# Patient Record
Sex: Female | Born: 1960 | Race: Black or African American | Hispanic: No | Marital: Single | Smoking: Current every day smoker
Health system: Southern US, Community
[De-identification: ages and names within clinical notes are randomized; demographics above are authoritative.]

## PROBLEM LIST (undated history)

## (undated) DIAGNOSIS — G43909 Migraine, unspecified, not intractable, without status migrainosus: Secondary | ICD-10-CM

## (undated) DIAGNOSIS — E119 Type 2 diabetes mellitus without complications: Secondary | ICD-10-CM

## (undated) DIAGNOSIS — H409 Unspecified glaucoma: Secondary | ICD-10-CM

## (undated) HISTORY — DX: Unspecified glaucoma: H40.9

## (undated) HISTORY — PX: GLAUCOMA SURGERY: SHX656

## (undated) HISTORY — DX: Migraine, unspecified, not intractable, without status migrainosus: G43.909

---

## 2018-06-22 ENCOUNTER — Other Ambulatory Visit: Payer: Self-pay | Admitting: Pediatric Intensive Care

## 2018-06-22 DIAGNOSIS — Z20822 Contact with and (suspected) exposure to covid-19: Secondary | ICD-10-CM

## 2018-06-23 LAB — NOVEL CORONAVIRUS, NAA: SARS-CoV-2, NAA: NOT DETECTED

## 2019-02-23 ENCOUNTER — Emergency Department
Admission: EM | Admit: 2019-02-23 | Discharge: 2019-02-23 | Disposition: A | Discharge status: Home / Self Care | Payer: Self-pay | Attending: Emergency Medicine | Admitting: Emergency Medicine

## 2019-02-23 ENCOUNTER — Other Ambulatory Visit: Payer: Self-pay

## 2019-02-23 DIAGNOSIS — H5462 Unqualified visual loss, left eye, normal vision right eye: Secondary | ICD-10-CM

## 2019-02-23 DIAGNOSIS — H53132 Sudden visual loss, left eye: Secondary | ICD-10-CM | POA: Insufficient documentation

## 2019-02-23 NOTE — Discharge Instructions (Addendum)
Go to [facility] immediately.

## 2019-02-23 NOTE — ED Triage Notes (Addendum)
Pt comes via POV from home with c/o Left eye pain. Pt state this started 2 weeks ago and has not gotten better.  Pt states dryness. Pt denies any discharge. Pt states it is cloudy.  Pt denies any numbess or tingling. Pt denies any CP or SOB.

## 2019-02-23 NOTE — ED Provider Notes (Signed)
[facility] Emergency Department Provider Note  ____________________________________________   First MD Initiated Contact with Patient 02/23/19 1510     (approximate)  I have reviewed the triage vital signs and the nursing notes.   HISTORY  Chief Complaint Eye Pain    HPI Natalie Gibson is a 59 y.o. female presents emergency department complaining of loss of vision in the left eye.  She states it started like a left clouding the left corner and has spread to the middle of the eye.  She cannot see details just outlines.  She does not use contacts.  No known injury.  Unsure of her hypertensive history.  She states she does not like to go to the doctor.  Patient states it is been worsening over the last 2 days.    History reviewed. No pertinent past medical history.  There are no problems to display for this patient.   History reviewed. No pertinent surgical history.  Prior to Admission medications   Not on File    Allergies Patient has no allergy information on record.  No family history on file.  Social History Social History   Tobacco Use  . Smoking status: Not on file  Substance Use Topics  . Alcohol use: Not on file  . Drug use: Not on file    Review of Systems  Constitutional: No fever/chills Eyes: Positive visual changes. ENT: No sore throat. Respiratory: Denies cough Cardiovascular: Denies chest pain Gastrointestinal: Denies abdominal pain Genitourinary: Negative for dysuria. Musculoskeletal: Negative for back pain. Skin: Negative for rash. Psychiatric: no mood changes,     ____________________________________________   PHYSICAL EXAM:  VITAL SIGNS: ED Triage Vitals  Enc Vitals Group     BP 02/23/19 1504 (!) 170/101     Pulse Rate 02/23/19 1504 100     Resp 02/23/19 1504 18     Temp 02/23/19 1504 98 F (36.7 C)     Temp src --      SpO2 02/23/19 1504 100 %     Weight 02/23/19 1502 272 lb (123.4 kg)     Height  02/23/19 1502 5\' 4"  (1.626 m)     Head Circumference --      Peak Flow --      Pain Score 02/23/19 1502 0     Pain Loc --      Pain Edu? --      Excl. in [place]? --     Constitutional: Alert and oriented. Well appearing and in no acute distress. Eyes: Conjunctivae are normal.  PERRL, sclera appears to be normal Head: Atraumatic. Nose: No congestion/rhinnorhea. Mouth/Throat: Mucous membranes are moist.   Neck:  supple no lymphadenopathy noted Cardiovascular: Normal rate, regular rhythm. Heart sounds are normal Respiratory: Normal respiratory effort.  No retractions, lungs c t a  GU: deferred Musculoskeletal: FROM all extremities, warm and well perfused Neurologic:  Normal speech and language.  Skin:  Skin is warm, dry and intact. No rash noted. Psychiatric: Mood and affect are normal. Speech and behavior are normal.  ____________________________________________   LABS (all labs ordered are listed, but only abnormal results are displayed)  Labs Reviewed - No data to display ____________________________________________   ____________________________________________  RADIOLOGY    ____________________________________________   PROCEDURES  Procedure(s) performed: No  Procedures    ____________________________________________   INITIAL IMPRESSION / ASSESSMENT AND PLAN / ED COURSE  Pertinent labs & imaging results that were available during my care of the patient were reviewed by me and considered  in my medical decision making (see chart for details).   Patient is 59 year old female presents emergency department with follow-up with complaints of decreased vision in the left eye.  Physical exam shows a visual acuity of 20/30 in the right eye and 20/200 in the left eye  Asked Dr. Derrill Kay if we should do MRI or CT.  Dr. Derrill Kay said to just call ophthalmology. Paged Dr. Druscilla Brownie   Dr. Druscilla Brownie wanted me to send the patient to the office immediately.  Explained  everything to the patient.  She was given the address and directions.  She was discharged in stable condition.  Natalie Gibson was evaluated in Emergency Department on 02/23/2019 for the symptoms described in the history of present illness. She was evaluated in the context of the global COVID-19 pandemic, which necessitated consideration that the patient might be at risk for infection with the SARS-CoV-2 virus that causes COVID-19. Institutional protocols and algorithms that pertain to the evaluation of patients at risk for COVID-19 are in a state of rapid change based on information released by regulatory bodies including the CDC and federal and state organizations. These policies and algorithms were followed during the patient's care in the ED.   As part of my medical decision making, I reviewed the following data within the electronic MEDICAL RECORD NUMBER Nursing notes reviewed and incorporated, Old chart reviewed, A consult was requested and obtained from this/these consultant(s) ophthalmology, Notes from prior ED visits and [place] Controlled Substance Database  ____________________________________________   FINAL CLINICAL IMPRESSION(S) / ED DIAGNOSES  Final diagnoses:  Visual loss, left eye      NEW MEDICATIONS STARTED DURING THIS VISIT:  There are no discharge medications for this patient.    Note:  This document was prepared using Dragon voice recognition software and may include unintentional dictation errors.    Faythe Ghee, PA-C 02/23/19 1652    Phineas Semen, MD 02/23/19 [phone]

## 2019-02-23 NOTE — ED Notes (Addendum)
Pt states she "cannot see" out of her left eye. States it started "like a cloud in the left corner" and has spread to the middle. States she cannot see "details" out of that eye, just outlines.  Pt wears bifocal glasses, denies contact use.  Pt denies any pain in eye.

## 2019-03-17 ENCOUNTER — Ambulatory Visit: Payer: Medicaid Other | Attending: Internal Medicine

## 2019-03-17 DIAGNOSIS — Z23 Encounter for immunization: Secondary | ICD-10-CM

## 2019-03-17 NOTE — Progress Notes (Signed)
   Covid-19 Vaccination Clinic  Name:  Mahek Schlesinger    MRN: [id number] DOB: 1960/01/27  03/17/2019  Ms. Leventhal was observed post Covid-19 immunization for 15 minutes without incident. She was provided with Vaccine Information Sheet and instruction to access the V-Safe system.   Ms. Kozuch was instructed to call 911 with any severe reactions post vaccine: Marland Kitchen Difficulty breathing  . Swelling of face and throat  . A fast heartbeat  . A bad rash all over body  . Dizziness and weakness   Immunizations Administered    Name Date Dose VIS Date Route   Pfizer COVID-19 Vaccine 03/17/2019  9:42 AM 0.3 mL 12/23/2018 Intramuscular   Manufacturer: [employer], [employer]   Lot: [id number]   NDC: [address]-4

## 2019-03-22 ENCOUNTER — Encounter: Payer: Self-pay | Admitting: Emergency Medicine

## 2019-03-22 ENCOUNTER — Other Ambulatory Visit: Payer: Self-pay

## 2019-03-22 ENCOUNTER — Emergency Department: Payer: Self-pay

## 2019-03-22 ENCOUNTER — Emergency Department
Admission: EM | Admit: 2019-03-22 | Discharge: 2019-03-22 | Disposition: A | Discharge status: Home / Self Care | Payer: Self-pay | Attending: Emergency Medicine | Admitting: Emergency Medicine

## 2019-03-22 DIAGNOSIS — F17210 Nicotine dependence, cigarettes, uncomplicated: Secondary | ICD-10-CM | POA: Insufficient documentation

## 2019-03-22 DIAGNOSIS — M25569 Pain in unspecified knee: Secondary | ICD-10-CM | POA: Insufficient documentation

## 2019-03-22 DIAGNOSIS — I879 Disorder of vein, unspecified: Secondary | ICD-10-CM | POA: Insufficient documentation

## 2019-03-22 DIAGNOSIS — M1711 Unilateral primary osteoarthritis, right knee: Secondary | ICD-10-CM | POA: Insufficient documentation

## 2019-03-22 DIAGNOSIS — G8929 Other chronic pain: Secondary | ICD-10-CM | POA: Insufficient documentation

## 2019-03-22 DIAGNOSIS — M25561 Pain in right knee: Secondary | ICD-10-CM

## 2019-03-22 LAB — BASIC METABOLIC PANEL
Anion gap: 9 (ref 5–15)
BUN: 10 mg/dL (ref 6–20)
CO2: 27 mmol/L (ref 22–32)
Calcium: 9 mg/dL (ref 8.9–10.3)
Chloride: 104 mmol/L (ref 98–111)
Creatinine, Ser: 0.78 mg/dL (ref 0.44–1.00)
GFR calc Af Amer: >60 mL/min (ref >60)
GFR calc non Af Amer: >60 mL/min (ref >60)
Glucose, Bld: 128 mg/dL — ABNORMAL HIGH (ref 70–99)
Potassium: 4.2 mmol/L (ref 3.5–5.1)
Sodium: 140 mmol/L (ref 135–145)

## 2019-03-22 LAB — CBC
HCT: 43.5 % (ref 36.0–46.0)
Hemoglobin: 14.2 g/dL (ref 12.0–15.0)
MCH: 31.1 pg (ref 26.0–34.0)
MCHC: 32.6 g/dL (ref 30.0–36.0)
MCV: 95.2 fL (ref 80.0–100.0)
Platelets: 239 10*3/uL (ref 150–400)
RBC: 4.57 MIL/uL (ref 3.87–5.11)
RDW: 13 % (ref 11.5–15.5)
WBC: 7 10*3/uL (ref 4.0–10.5)
nRBC: 0 % (ref 0.0–0.2)

## 2019-03-22 NOTE — ED Notes (Signed)
Pt to US.

## 2019-03-22 NOTE — ED Notes (Signed)
This RN at bedside. Pt found walking in room with a steady gait.

## 2019-03-22 NOTE — Discharge Instructions (Signed)
Your x-ray showed some arthritis in your right knee.  Your lab work was all reassuring.  Your ultrasound did not show a blood clot.  It did show that the blood flow is mildly decreased in one of the veins in your left leg.  Please call vascular surgery today or tomorrow morning for a follow-up appointment.  Please also establish with primary care.  I have given you some contacts to call.

## 2019-03-22 NOTE — ED Triage Notes (Signed)
Pt presents to ED via POV, c/o bilateral knee pain x 2 weeks that is worse at night, pt also c/o swelling to bilateral knees at this time x 2 weeks. Pt ambulatory from the lobby at this time.

## 2019-03-22 NOTE — ED Provider Notes (Signed)
[facility] Emergency Department Provider Note  ____________________________________________  Time seen: Approximately 1:19 PM  I have reviewed the triage vital signs and the nursing notes.   HISTORY  Chief Complaint Knee Pain    HPI Natalie Gibson is a 59 y.o. female that presents to the emergency department for evaluation of bilateral knee pain and swelling for 1+ years.  Patient states that it seems to have gotten worse though over the last month or two.  Pain and swelling is to the front of her knees.  Patient states that she will also get leg cramps at night for the last year.  She will have to walk around the house for an hour to rid the cramps.Cramps are primarily to the front of her thight.  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  She does have some mild chronic back pain that is worse when she lifts her mother, but this is not very bad.  No specific trauma. Patient does not have a primary care provider.  Pain has not changed today. No bowel or bladder dysfunction or saddle anesthesia. No numbness, tingling.  History reviewed. No pertinent past medical history.  There are no problems to display for this patient.   History reviewed. No pertinent surgical history.  Prior to Admission medications   Not on File    Allergies Patient has no known allergies.  No family history on file.  Social History Social History   Tobacco Use  . Smoking status: Current Every Day Smoker    Packs/day: 1.00    Types: Cigarettes  . Smokeless tobacco: Never Used  Substance Use Topics  . Alcohol use: Not Currently  . Drug use: Not Currently     Review of Systems  Constitutional: No fever/chills Cardiovascular: No chest pain. Respiratory: No SOB. Gastrointestinal: No abdominal pain.  No nausea, no vomiting.  Musculoskeletal: Positive for chronic back, knee, leg pain. Skin: Negative for  rash, abrasions, lacerations, ecchymosis. Neurological: Negative for headaches, numbness or tingling   ____________________________________________   PHYSICAL EXAM:  VITAL SIGNS: ED Triage Vitals  Enc Vitals Group     BP 03/22/19 1232 (!) 130/108     Pulse Rate 03/22/19 1229 95     Resp 03/22/19 1229 20     Temp 03/22/19 1229 98.7 F (37.1 C)     Temp Source 03/22/19 1229 Oral     SpO2 03/22/19 1229 95 %     Weight 03/22/19 1229 272 lb (123.4 kg)     Height 03/22/19 1229 5\' 4"  (1.626 m)     Head Circumference --      Peak Flow --      Pain Score --      Pain Loc --      Pain Edu? --      Excl. in [place]? --      Constitutional: Alert and oriented. Well appearing and in no acute distress. Eyes: Conjunctivae are normal. PERRL. EOMI. Head: Atraumatic. ENT:      Ears:      Nose: No congestion/rhinnorhea.      Mouth/Throat: Mucous membranes are moist.  Neck: No stridor.  Cardiovascular: Normal rate, regular rhythm.  Good peripheral circulation.  Symmetric pedal pulses bilaterally. Respiratory: Normal respiratory effort without tachypnea or retractions. Lungs CTAB. Good air entry to the bases with no decreased or absent breath sounds. Musculoskeletal: Full range of motion to all extremities. No gross deformities  appreciated.  Mild swelling to inferior and lateral bilateral patella.  No overlying erythema.  No tenderness to palpation to popliteal fossa.  No calf tenderness.  No ankle swelling. Normal gait. Neurologic:  Normal speech and language. No gross focal neurologic deficits are appreciated.  Skin:  Skin is warm, dry and intact. No rash noted. Psychiatric: Mood and affect are normal. Speech and behavior are normal. Patient exhibits appropriate insight and judgement.   ____________________________________________   LABS (all labs ordered are listed, but only abnormal results are displayed)  Labs Reviewed  BASIC METABOLIC PANEL - Abnormal; Notable for the following  components:      Result Value   Glucose, Bld 128 (*)    All other components within normal limits  CBC   ____________________________________________  EKG   ____________________________________________  RADIOLOGY Lexine Baton, personally viewed and evaluated these images (plain radiographs) as part of my medical decision making, as well as reviewing the written report by the radiologist.  US Venous Img Lower Bilateral  Result Date: 03/22/2019 CLINICAL DATA:  Lower extremity pain and swelling EXAM: BILATERAL LOWER EXTREMITY VENOUS DUPLEX ULTRASOUND TECHNIQUE: Gray-scale sonography with graded compression, as well as color Doppler and duplex ultrasound were performed to evaluate the lower extremity deep venous systems from the level of the common femoral vein and including the common femoral, femoral, profunda femoral, popliteal and calf veins including the posterior tibial, peroneal and gastrocnemius veins when visible. The superficial great saphenous vein was also interrogated. Spectral Doppler was utilized to evaluate flow at rest and with distal augmentation maneuvers in the common femoral, femoral and popliteal veins. COMPARISON:  None. FINDINGS: RIGHT LOWER EXTREMITY Common Femoral Vein: No evidence of thrombus. Normal compressibility, respiratory phasicity and response to augmentation. Saphenofemoral Junction: No evidence of thrombus. Normal compressibility and flow on color Doppler imaging. Profunda Femoral Vein: No evidence of thrombus. Normal compressibility and flow on color Doppler imaging. Femoral Vein: No evidence of thrombus. Normal compressibility, respiratory phasicity and response to augmentation. Popliteal Vein: No evidence of thrombus. Normal compressibility, respiratory phasicity and response to augmentation. Calf Veins: No evidence of thrombus. Normal compressibility and flow on color Doppler imaging. Superficial Great Saphenous Vein: No evidence of thrombus. Normal  compressibility. Venous Reflux:  None. Other Findings:  None. LEFT LOWER EXTREMITY Common Femoral Vein: No evidence of thrombus. Normal compressibility, respiratory phasicity and response to augmentation. Saphenofemoral Junction: No evidence of thrombus. Normal compressibility and flow on color Doppler imaging. Profunda Femoral Vein: No evidence of thrombus. Normal compressibility and flow on color Doppler imaging. Femoral Vein: No evidence of thrombus. Normal compressibility, respiratory phasicity and response to augmentation. Popliteal Vein: No evidence of thrombus. Normal compressibility, respiratory phasicity and response to augmentation. There is somewhat stagnant venous flow in the left popliteal vein. Calf Veins: No evidence of thrombus. Normal compressibility and flow on color Doppler imaging. Superficial Great Saphenous Vein: No evidence of thrombus. Normal compressibility. Venous Reflux:  None. Other Findings:  None. IMPRESSION: No evidence of deep venous thrombosis in either lower extremity. Note that flow in the left popliteal vein is somewhat stagnant. Clinical significance of this finding uncertain. This vessel compresses normally and shows normal Doppler signal. Electronically Signed   By: Bretta Bang III M.D.   On: 03/22/2019 14:40   DG Knee Complete 4 Views Left  Result Date: 03/22/2019 CLINICAL DATA:  Bilateral knee pain over the last 2 weeks which is worsening. EXAM: LEFT KNEE - COMPLETE 4+ VIEW COMPARISON:  None. FINDINGS: No evidence of fracture,  dislocation, or joint effusion. No evidence of arthropathy or other focal bone abnormality. Soft tissues are unremarkable. IMPRESSION: Negative. Electronically Signed   By: Nelson Chimes M.D.   On: 03/22/2019 14:34   DG Knee Complete 4 Views Right  Result Date: 03/22/2019 CLINICAL DATA:  Bilateral knee pain over the last 2 weeks which is worsening. EXAM: RIGHT KNEE - COMPLETE 4+ VIEW COMPARISON:  None. FINDINGS: No visible joint effusion.  Mild osteoarthritis of the weight-bearing compartments and patellofemoral joint. Mild tibiofibular osteoarthritis. No fracture or acute finding otherwise. IMPRESSION: Mild tricompartmental osteoarthritis. No acute finding. Electronically Signed   By: Nelson Chimes M.D.   On: 03/22/2019 14:34    ____________________________________________    PROCEDURES  Procedure(s) performed:    Procedures    Medications - No data to display   ____________________________________________   INITIAL IMPRESSION / ASSESSMENT AND PLAN / ED COURSE  Pertinent labs & imaging results that were available during my care of the patient were reviewed by me and considered in my medical decision making (see chart for details).  Review of the [place] CSRS was performed in accordance of the [place] prior to dispensing any controlled drugs.   Patient presented to the emergency department for evaluation of chronic bilateral knee pain and swelling.  Vital signs and exam are reassuring.  Lab work is largely unremarkable.  Right knee x-ray consistent with osteoarthritis.  Left knee x-ray negative for bony abnormality.  Ultrasound negative for DVT.  Radiology does comment on some somewhat stagnant flow to the left popliteal vein of unknown clinical significance on ultrasound.  This does not seem to explain patient's symptoms.  She will follow-up with vascular regarding this.  No lower leg edema.  She has symmetric pedal pulses. 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  Patient is to follow up with vascular and PCP  as directed. Patient is given ED precautions to return to the ED for any worsening or new symptoms.   Calli Bashor was evaluated in Emergency Department on 03/22/2019 for the symptoms described in the history of present illness. She was evaluated in the context of the global COVID-19 pandemic, which necessitated consideration that the patient might be at risk for  infection with the SARS-CoV-2 virus that causes COVID-19. Institutional protocols and algorithms that pertain to the evaluation of patients at risk for COVID-19 are in a state of rapid change based on information released by regulatory bodies including the CDC and federal and state organizations. These policies and algorithms were followed during the patient's care in the ED.  ____________________________________________  FINAL CLINICAL IMPRESSION(S) / ED DIAGNOSES  Final diagnoses:  Chronic pain of both knees  Osteoarthritis of right knee, unspecified osteoarthritis type  Vein disorder      NEW MEDICATIONS STARTED DURING THIS VISIT:  ED Discharge Orders    None          This chart was dictated using voice recognition software/Dragon. Despite best efforts to proofread, errors can occur which can change the meaning. Any change was purely unintentional.    Laban Emperor, PA-C 03/22/19 1810    Harvest Dark, MD 03/23/19 1316

## 2019-04-07 ENCOUNTER — Ambulatory Visit: Payer: Medicaid Other | Attending: Internal Medicine

## 2019-04-07 DIAGNOSIS — Z23 Encounter for immunization: Secondary | ICD-10-CM

## 2019-04-07 NOTE — Progress Notes (Signed)
   Covid-19 Vaccination Clinic  Name:  Natalie Gibson    MRN: [id number] DOB: 03/19/1960  04/07/2019  [name] was observed post Covid-19 immunization for 15 minutes without incident. She was provided with Vaccine Information Sheet and instruction to access the V-Safe system.   [name] was instructed to call 911 with any severe reactions post vaccine: Marland Kitchen Difficulty breathing  . Swelling of face and throat  . A fast heartbeat  . A bad rash all over body  . Dizziness and weakness   Immunizations Administered    Name Date Dose VIS Date Route   Pfizer COVID-19 Vaccine 04/07/2019  8:53 AM 0.3 mL 12/23/2018 Intramuscular   Manufacturer: [employer], [employer]   Lot: [id number]   NDC: [address]-2

## 2020-07-01 IMAGING — DX DG KNEE COMPLETE 4+V*R*
4 series · 4 of 4 positions shown · non-contrast
Comparison: None.

CLINICAL DATA: Bilateral knee pain over the last 2 weeks which is
worsening.

EXAM:
RIGHT KNEE - COMPLETE 4+ VIEW

[knee ap]
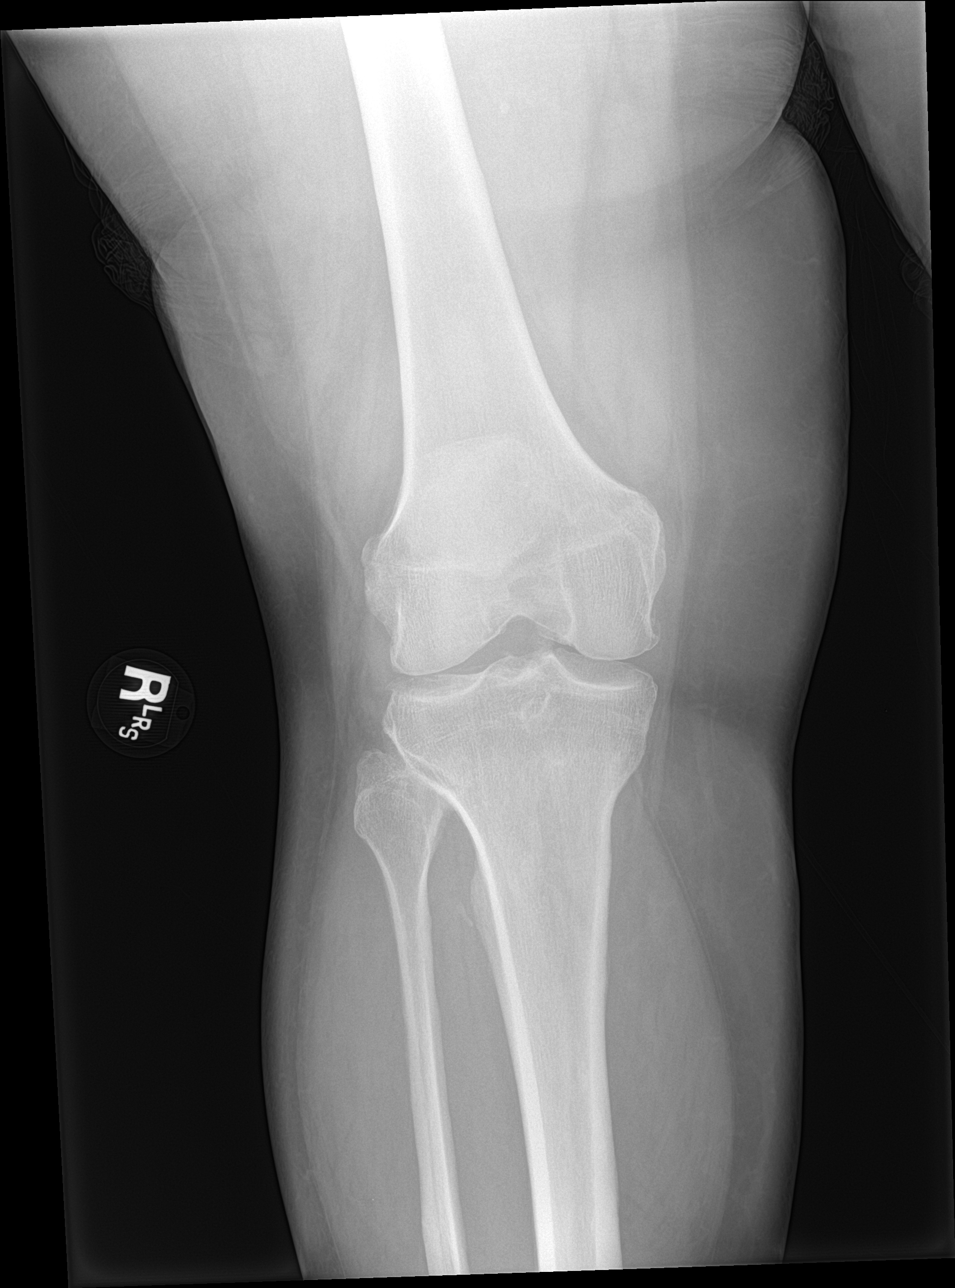

[knee lat]
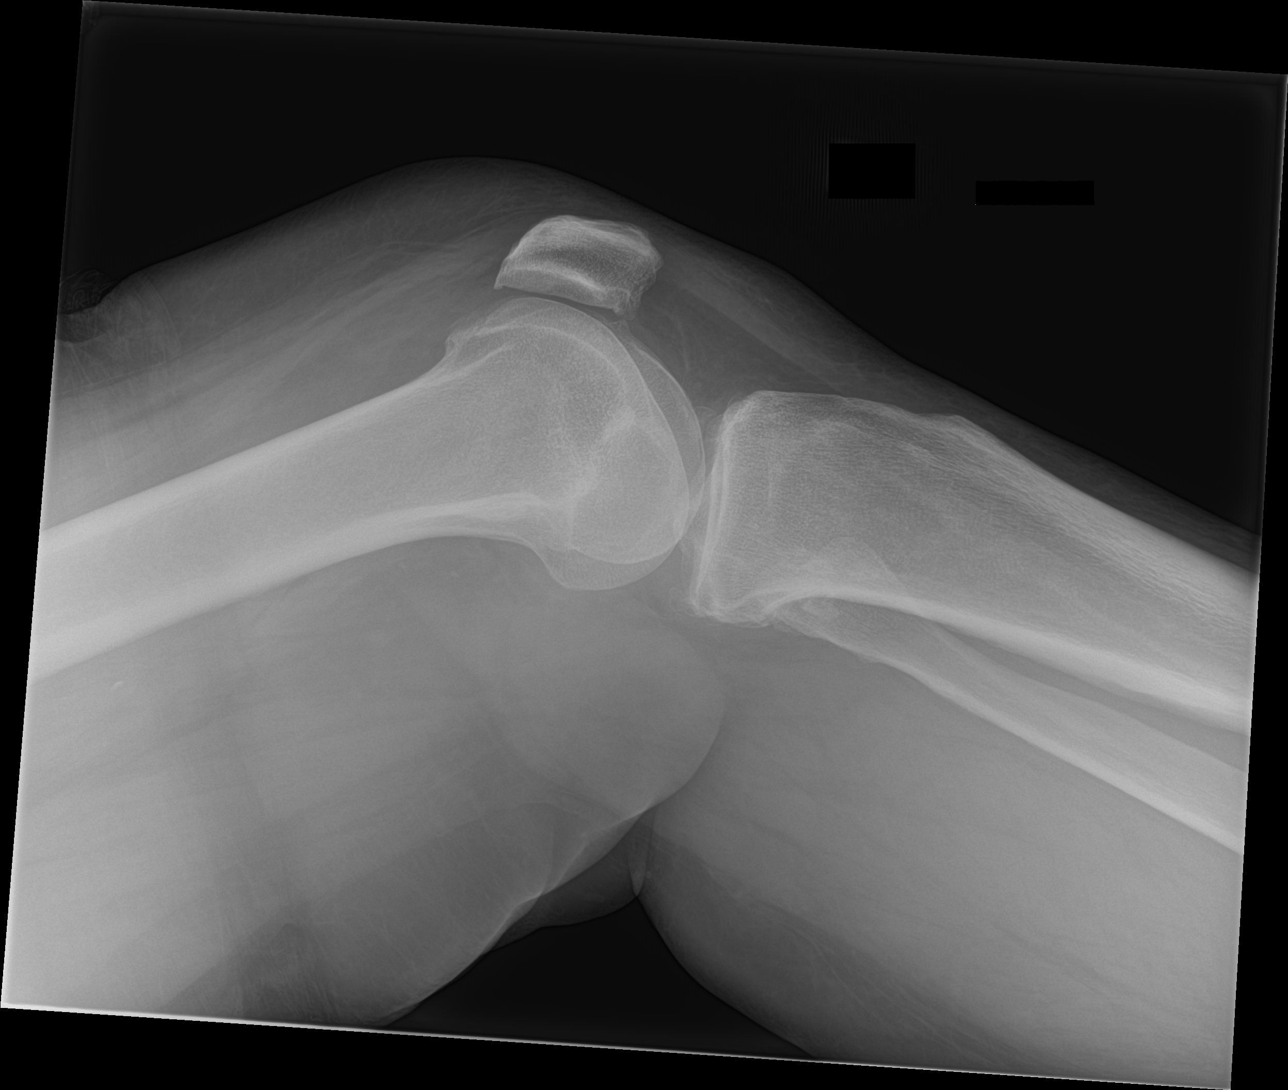

[knee obl (1 of 2)]
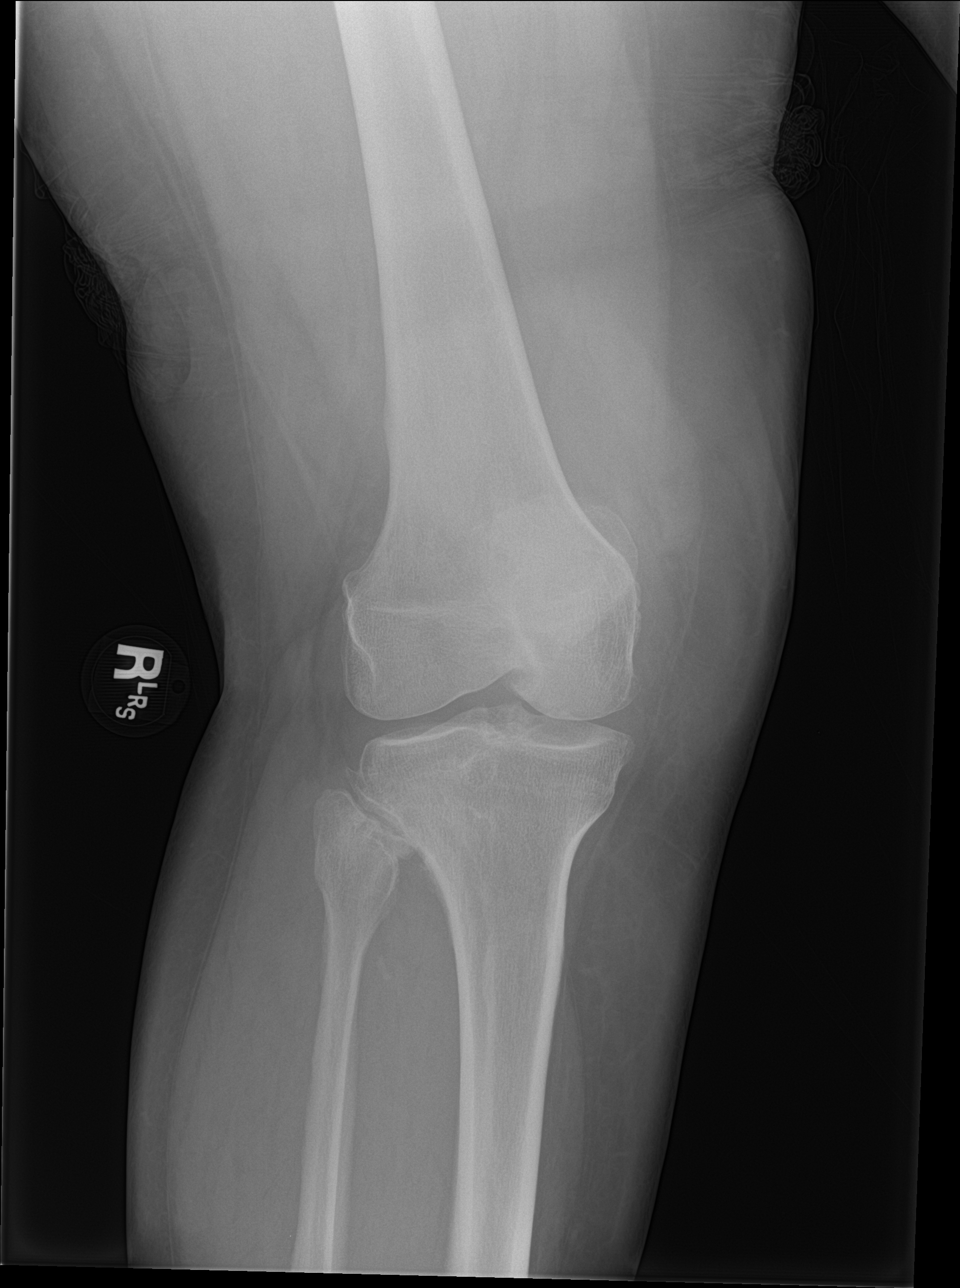

[knee obl (2 of 2)]
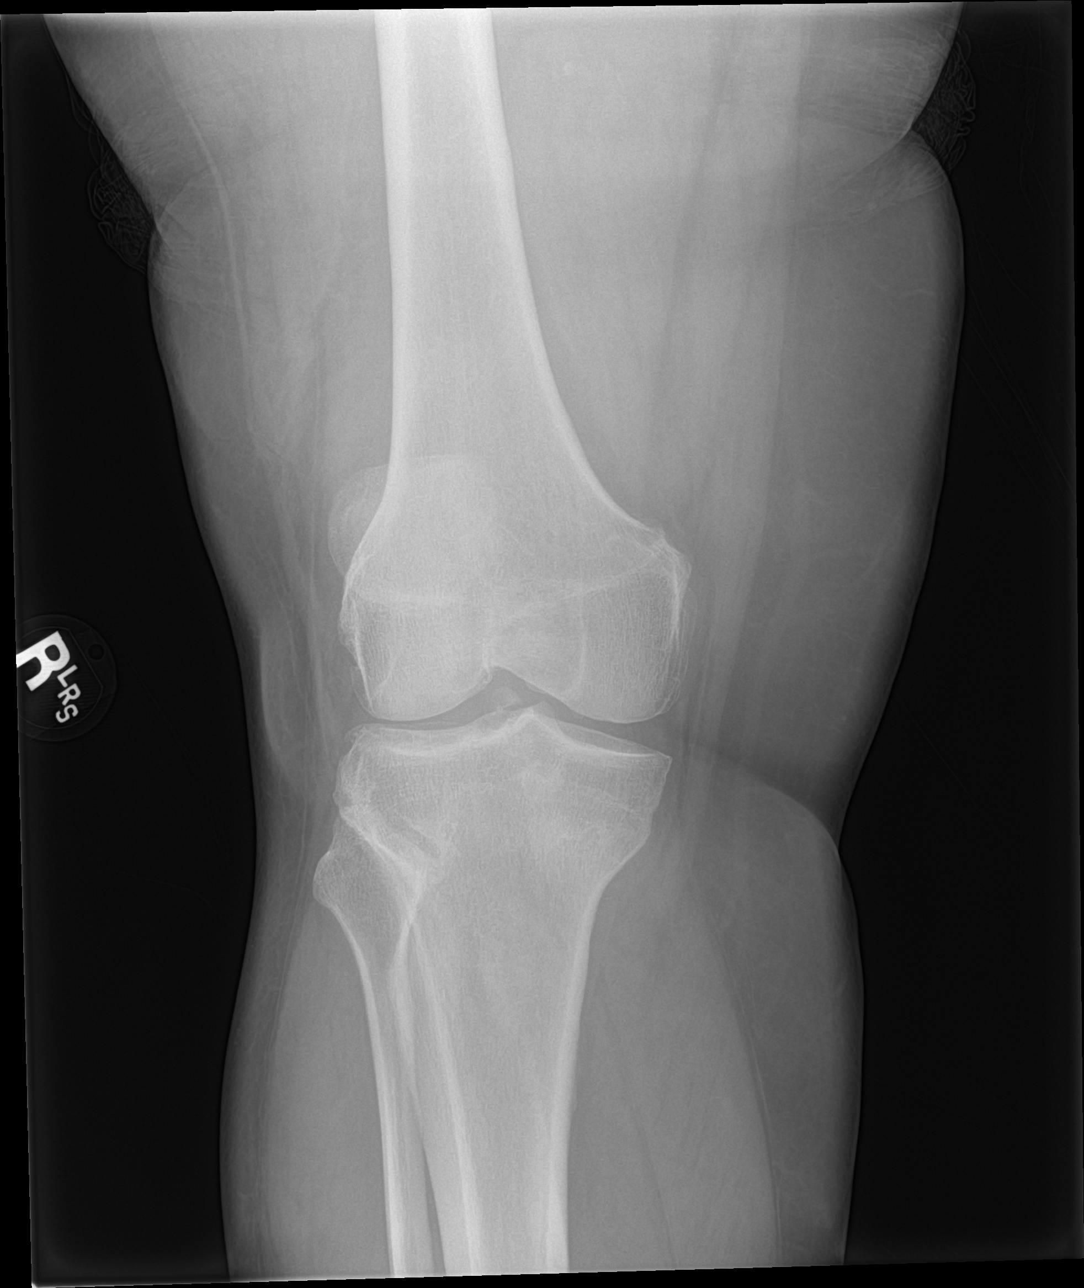

[4 of 4 positions shown; findings below may reference images not displayed]

FINDINGS: No visible joint effusion. Mild osteoarthritis of the weight-bearing
compartments and patellofemoral joint. Mild tibiofibular
osteoarthritis. No fracture or acute finding otherwise.
IMPRESSION: Mild tricompartmental osteoarthritis. No acute finding.

## 2020-07-01 IMAGING — US US EXTREM LOW VENOUS
1 series · 13 of 24 positions shown · non-contrast
Comparison: None.

CLINICAL DATA: Lower extremity pain and swelling

EXAM:
BILATERAL LOWER EXTREMITY VENOUS DUPLEX ULTRASOUND
TECHNIQUE: Gray-scale sonography with graded compression, as well as color
Doppler and duplex ultrasound were performed to evaluate the lower
extremity deep venous systems from the level of the common femoral
vein and including the common femoral, femoral, profunda femoral,
popliteal and calf veins including the posterior tibial, peroneal
and gastrocnemius veins when visible. The superficial great
saphenous vein was also interrogated. Spectral Doppler was utilized
to evaluate flow at rest and with distal augmentation maneuvers in
the common femoral, femoral and popliteal veins.

[Series 1: us venous img lower bilat (dvt) · portal-venous · 13 of 55 slices shown]
[im 1/55]
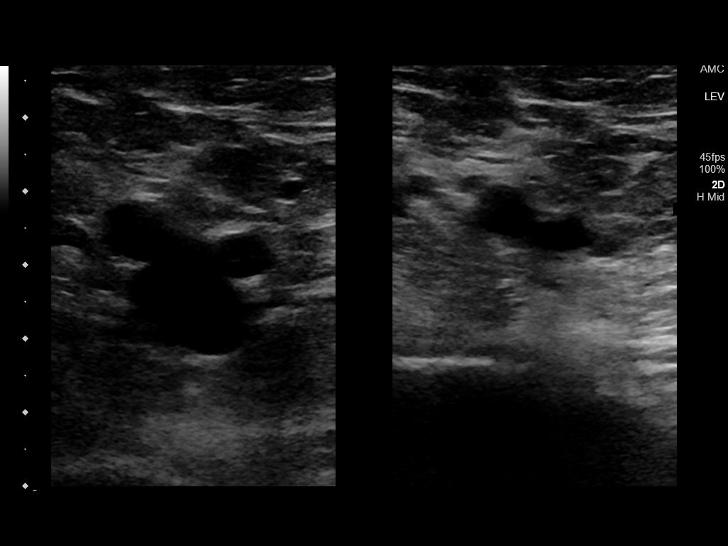
[im 5/55]
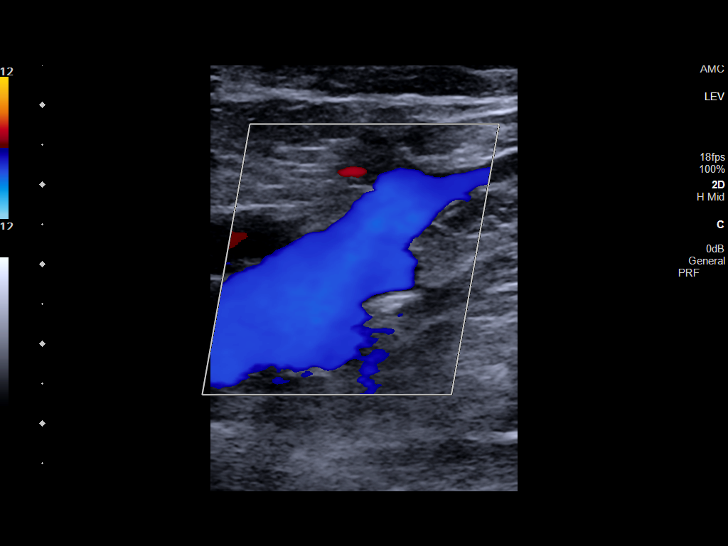
[im 10/55]
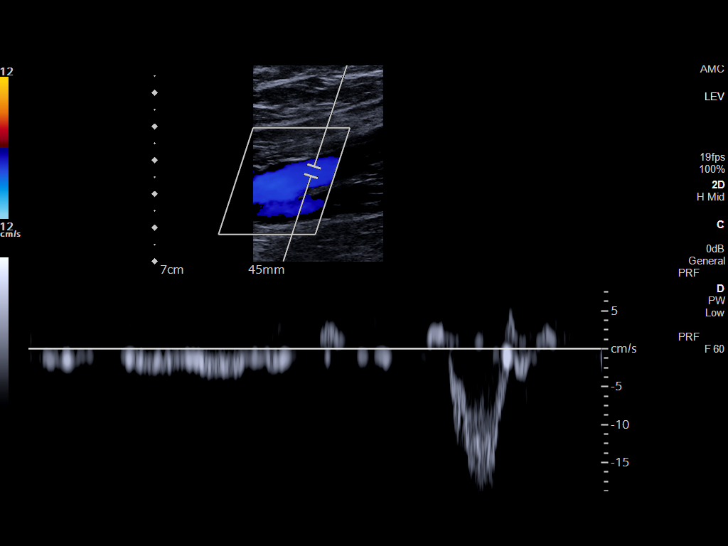
[im 15/55]
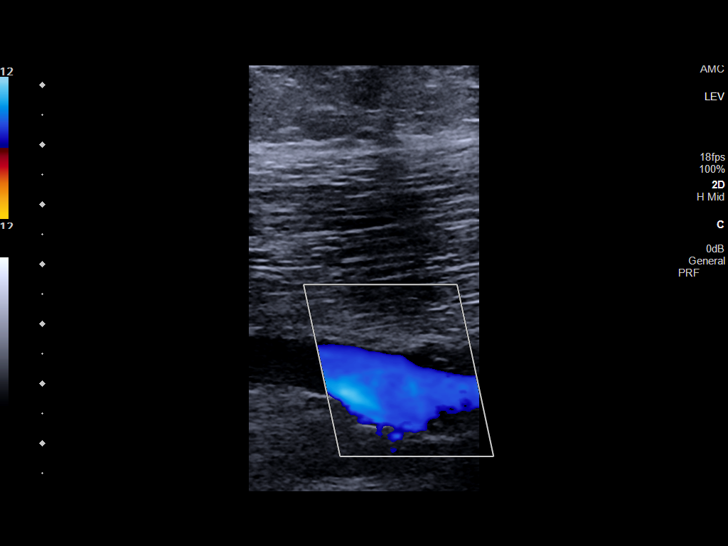
[im 19/55]
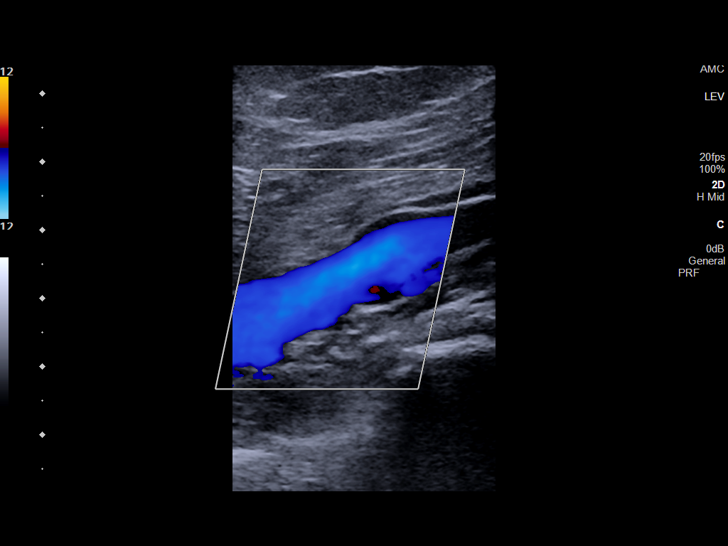
[im 24/55]
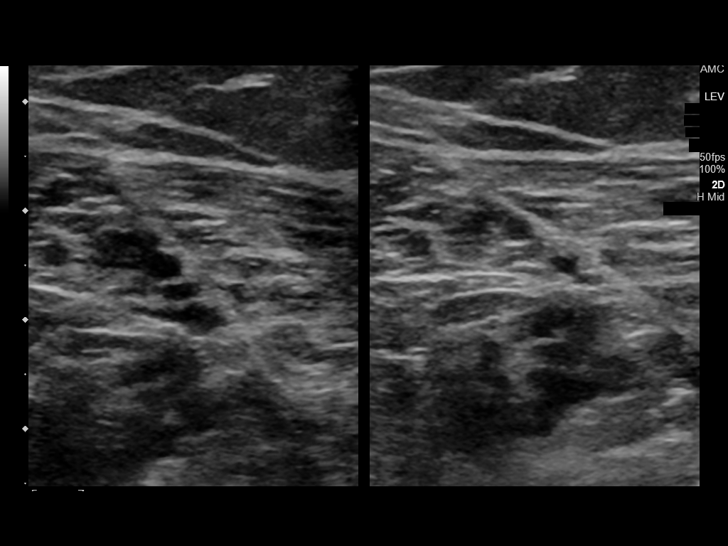
[im 29/55]
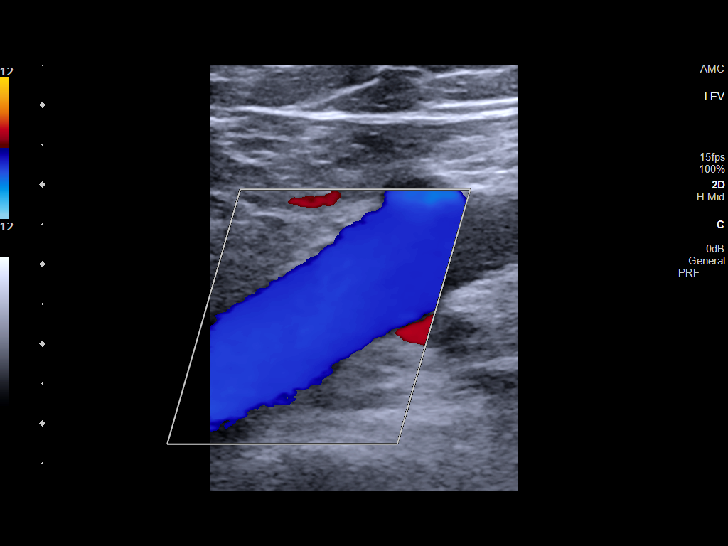
[im 31/55]
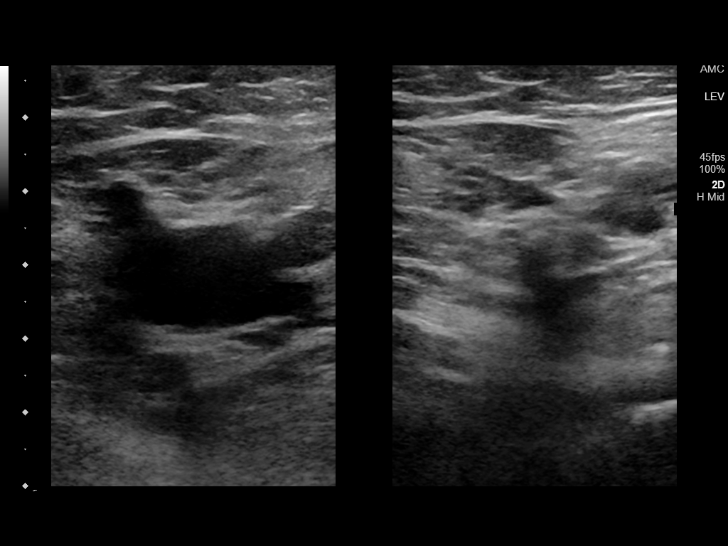
[im 36/55]
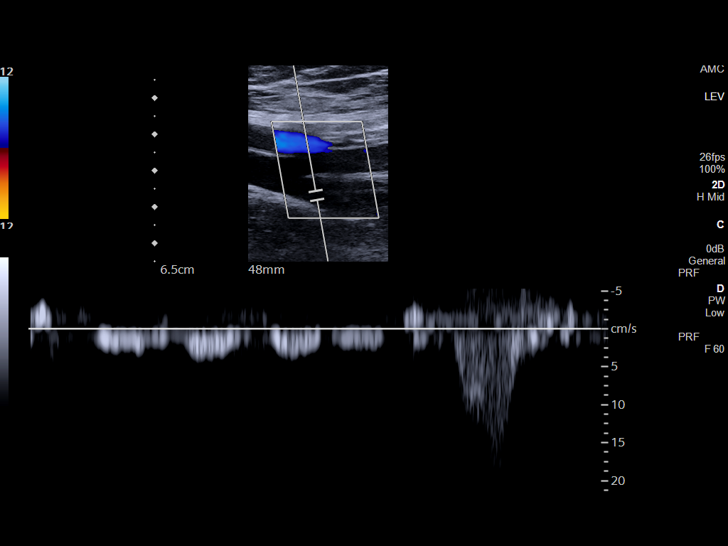
[im 40/55]
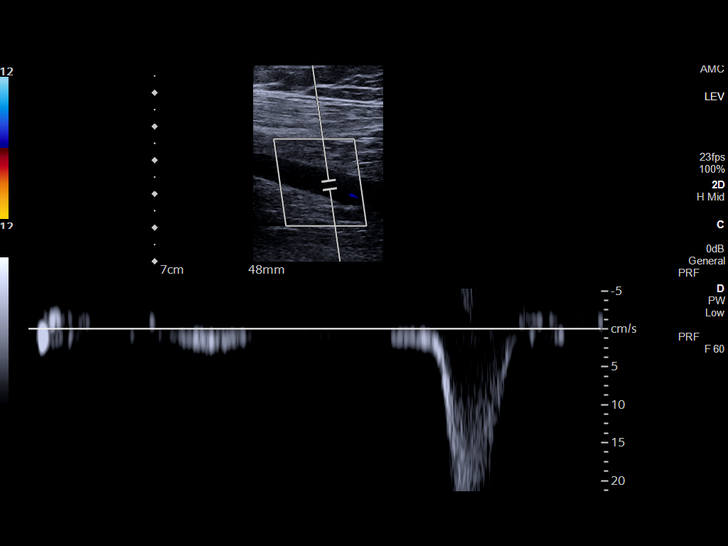
[im 45/55]
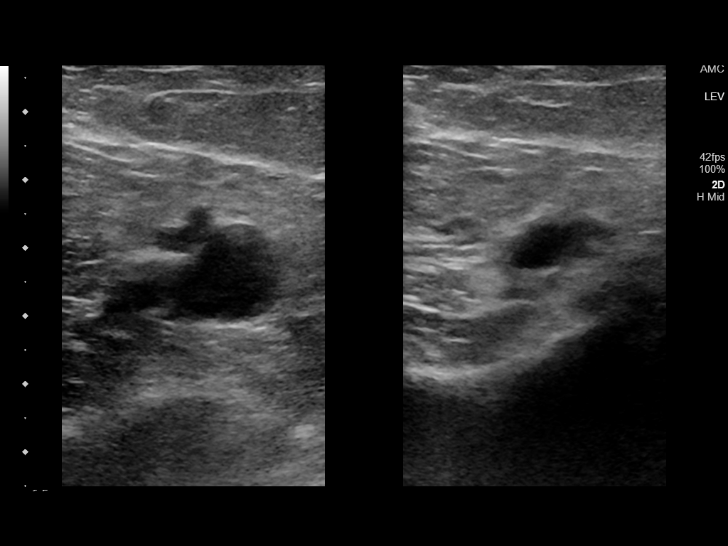
[im 50/55]
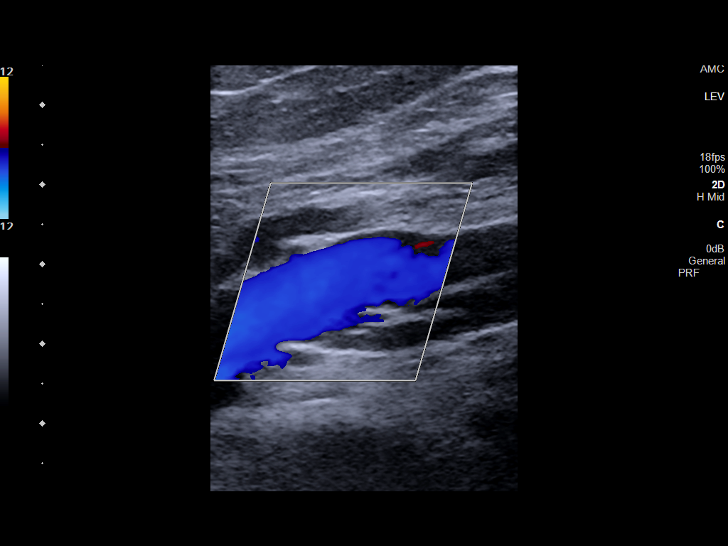
[im 55/55]
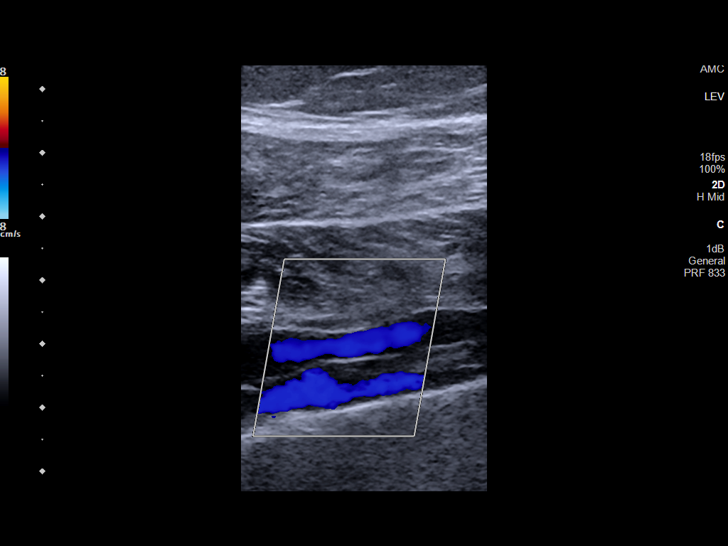

[13 of 24 positions shown; findings below may reference images not displayed]

FINDINGS: RIGHT LOWER EXTREMITY

Common Femoral Vein: No evidence of thrombus. Normal
compressibility, respiratory phasicity and response to augmentation.

Saphenofemoral Junction: No evidence of thrombus. Normal
compressibility and flow on color Doppler imaging.

Profunda Femoral Vein: No evidence of thrombus. Normal
compressibility and flow on color Doppler imaging.

Femoral Vein: No evidence of thrombus. Normal compressibility,
respiratory phasicity and response to augmentation.

Popliteal Vein: No evidence of thrombus. Normal compressibility,
respiratory phasicity and response to augmentation.

Calf Veins: No evidence of thrombus. Normal compressibility and flow
on color Doppler imaging.

Superficial Great Saphenous Vein: No evidence of thrombus. Normal
compressibility.

Venous Reflux:  None.

Other Findings:  None.

LEFT LOWER EXTREMITY

Common Femoral Vein: No evidence of thrombus. Normal
compressibility, respiratory phasicity and response to augmentation.

Saphenofemoral Junction: No evidence of thrombus. Normal
compressibility and flow on color Doppler imaging.

Profunda Femoral Vein: No evidence of thrombus. Normal
compressibility and flow on color Doppler imaging.

Femoral Vein: No evidence of thrombus. Normal compressibility,
respiratory phasicity and response to augmentation.

Popliteal Vein: No evidence of thrombus. Normal compressibility,
respiratory phasicity and response to augmentation. There is
somewhat stagnant venous flow in the left popliteal vein.

Calf Veins: No evidence of thrombus. Normal compressibility and flow
on color Doppler imaging.

Superficial Great Saphenous Vein: No evidence of thrombus. Normal
compressibility.

Venous Reflux:  None.

Other Findings:  None.
IMPRESSION: No evidence of deep venous thrombosis in either lower extremity.
Note that flow in the left popliteal vein is somewhat stagnant.
Clinical significance of this finding uncertain. This vessel
compresses normally and shows normal Doppler signal.

## 2021-09-20 ENCOUNTER — Other Ambulatory Visit: Payer: Self-pay

## 2021-09-20 ENCOUNTER — Emergency Department: Payer: Medicaid Other

## 2021-09-20 ENCOUNTER — Emergency Department
Admission: EM | Admit: 2021-09-20 | Discharge: 2021-09-21 | Disposition: A | Discharge status: Home / Self Care | Payer: Medicaid Other | Attending: Emergency Medicine | Admitting: Emergency Medicine

## 2021-09-20 DIAGNOSIS — W010XXA Fall on same level from slipping, tripping and stumbling without subsequent striking against object, initial encounter: Secondary | ICD-10-CM | POA: Insufficient documentation

## 2021-09-20 DIAGNOSIS — F17210 Nicotine dependence, cigarettes, uncomplicated: Secondary | ICD-10-CM | POA: Insufficient documentation

## 2021-09-20 DIAGNOSIS — M25552 Pain in left hip: Secondary | ICD-10-CM | POA: Insufficient documentation

## 2021-09-20 DIAGNOSIS — S32422A Displaced fracture of posterior wall of left acetabulum, initial encounter for closed fracture: Secondary | ICD-10-CM

## 2021-09-20 MED ORDER — OXYCODONE-ACETAMINOPHEN 5-325 MG PO TABS: 1.0000 | ORAL_TABLET | Freq: Four times a day (QID) | ORAL | 0 refills | Status: DC | PRN

## 2021-09-20 NOTE — ED Notes (Signed)
Pt declined discharge vital signs  

## 2021-09-20 NOTE — ED Notes (Signed)
Pt verbalized understanding of discharge instructions, follow-up care instructions, and risk of leaving AMA.

## 2021-09-20 NOTE — ED Provider Notes (Signed)
[facility] Provider Note   ____________________________________________   Event Date/Time   First MD Initiated Contact with Patient 09/20/21 1911     (approximate)  I have reviewed the triage vital signs and the nursing notes.   HISTORY  Chief Complaint Leg Injury    HPI Natalie Gibson is a 61 y.o. female Natalie Gibson to the emergency room after a fall yesterday patient reports that she was walking in her yard and fell over a stake in the yard.  Patient reports that she fell on her left side.  Since that time she has been unable to bear weight on her left leg.  She is complaining of significant amount of pain in the left hip.  She reports that her nephew was having to help her ambulate to and from the restroom.  She reports that she is unable to even stand from a sitting position without assistance.  She reports that laying on that side or sitting causes a significant amount of pain.  Therefore, she has been laying in the bed for the majority of the time since the fall. On exam there is significant amount of pain with palpation to the left hip.  She also has significant pain with medial and lateral rotation of the hip, more prominent with lateral rotation.  She has significant amount of pain with flexion of the hip, not as prominent with extension.  There is no shortening or rotation noted Patient reports that she has been taken Tylenol and ibuprofen for the pain with no relief.  She reports that the pain currently is a 4 out of 10 with no movement and an 8 out of 10 with movement. .    History reviewed. No pertinent past medical history.  There are no problems to display for this patient.   History reviewed. No pertinent surgical history.  Prior to Admission medications   Not on File    Allergies Patient has no known allergies.  History reviewed. No pertinent family history.  Social History Social History   Tobacco Use   Smoking  status: Every Day    Packs/day: 1.00    Types: Cigarettes   Smokeless tobacco: Never  Substance Use Topics   Alcohol use: Not Currently   Drug use: Not Currently    Review of Systems  Constitutional: No fever/chills Eyes: No visual changes. ENT: No sore throat. Cardiovascular: Denies chest pain. Respiratory: Denies shortness of breath. Gastrointestinal: No abdominal pain.  No nausea, no vomiting.  No diarrhea.  No constipation. Genitourinary: Negative for dysuria. Musculoskeletal: Positive for left hip pain Skin: Negative for rash. Neurological: Negative for headaches, focal weakness or numbness.   ____________________________________________   PHYSICAL EXAM:  VITAL SIGNS: ED Triage Vitals  Enc Vitals Group     BP 09/20/21 1751 (!) 158/75     Pulse Rate 09/20/21 1751 79     Resp 09/20/21 1751 18     Temp 09/20/21 1751 98.7 F (37.1 C)     Temp Source 09/20/21 1751 Oral     SpO2 09/20/21 1751 92 %     Weight 09/20/21 1757 240 lb (108.9 kg)     Height 09/20/21 1757 5\' 8"  (1.727 m)     Head Circumference --      Peak Flow --      Pain Score 09/20/21 1757 10     Pain Loc --      Pain Edu? --      Excl. in GC? --  Constitutional: Alert and oriented. Well appearing and in no acute distress. Eyes: Conjunctivae are normal. PERRL. EOMI. Head: Atraumatic. Nose: No congestion/rhinnorhea. Mouth/Throat: Mucous membranes are moist.  Oropharynx non-erythematous. Neck: No stridor.   Cardiovascular: Normal rate, regular rhythm. Grossly normal heart sounds.  Good peripheral circulation. Respiratory: Normal respiratory effort.  No retractions. Lungs CTAB. Gastrointestinal: Soft and nontender. No distention. No abdominal bruits. No CVA tenderness. Musculoskeletal: On exam there is significant amount of pain with palpation to the left hip.  She also has significant pain with medial and lateral rotation of the hip, more prominent with lateral rotation.  She has significant  amount of pain with flexion of the hip, not as prominent with extension.  There is no shortening or rotation noted Neurologic:  Normal speech and language. No gross focal neurologic deficits are appreciated. No gait instability. Skin:  Skin is warm, dry and intact. No rash noted. Psychiatric: Mood and affect are normal. Speech and behavior are normal.  ____________________________________________   LABS (all labs ordered are listed, but only abnormal results are displayed)  Labs Reviewed - No data to display ____________________________________________  EKG  ____________________________________________  RADIOLOGY  ED MD interpretation: Femur and pelvic x-ray were reviewed by me and read by radiologist.  Official radiology report(s): DG FEMUR MIN 2 VIEWS LEFT  Result Date: 09/20/2021 CLINICAL DATA:  Larey Seat yesterday. Pain. Difficulty with weight-bearing. EXAM: LEFT FEMUR 2 VIEWS; PELVIS - 1-2 VIEW COMPARISON:  Left knee radiographs 03/22/2019. FINDINGS: Pelvis is unremarkable. The left hip is located. Degenerative changes are present at the left knee. No acute bone or soft tissue abnormalities are present. Vascular calcifications are noted. IMPRESSION: 1. No acute abnormality. 2. Degenerative changes of the left knee. 3. Atherosclerosis. Electronically Signed   By: Marin Roberts M.D.   On: 09/20/2021 19:10   DG Pelvis 1-2 Views  Result Date: 09/20/2021 CLINICAL DATA:  Larey Seat yesterday. Pain. Difficulty with weight-bearing. EXAM: LEFT FEMUR 2 VIEWS; PELVIS - 1-2 VIEW COMPARISON:  Left knee radiographs 03/22/2019. FINDINGS: Pelvis is unremarkable. The left hip is located. Degenerative changes are present at the left knee. No acute bone or soft tissue abnormalities are present. Vascular calcifications are noted. IMPRESSION: 1. No acute abnormality. 2. Degenerative changes of the left knee. 3. Atherosclerosis. Electronically Signed   By: Marin Roberts M.D.   On: 09/20/2021 19:10     ____________________________________________   PROCEDURES  Procedure(s) performed: None  Procedures  Critical Care performed: No  ____________________________________________   INITIAL IMPRESSION / ASSESSMENT AND PLAN / ED COURSE     Natalie Gibson is a 61 y.o. female Natalie Gibson to the emergency room after a fall yesterday patient reports that she was walking in her yard and fell over a stake in the yard.  Patient reports that she fell on her left side.  Since that time she has been unable to bear weight on her left leg.  She is complaining of significant amount of pain in the left hip.  She reports that her nephew was having to help her ambulate to and from the restroom.  She reports that she is unable to even stand from a sitting position without assistance.  She reports that laying on that side or sitting causes a significant amount of pain.  Therefore, she has been laying in the bed for the majority of the time since the fall. On exam there is significant amount of pain with palpation to the left hip.  She also has significant pain with medial and lateral  rotation of the hip, more prominent with lateral rotation.  She has significant amount of pain with flexion of the hip, not as prominent with extension.  There is no shortening or rotation noted Patient reports that she has been taken Tylenol and ibuprofen for the pain with no relief.  She reports that the pain currently is a 4 out of 10 with no movement and an 8 out of 10 with movement.  Patient had x-ray of left femur and left pelvis. No acute abnormalities were noted. However, due to the significant amount of pain and inability to bear weight I will order a CT of left hip.   Hand off given to Gala Romney [facility]     ____________________________________________   FINAL CLINICAL IMPRESSION(S) / ED DIAGNOSES  Final diagnoses:  None     ED Discharge Orders     None        Note:  This document was prepared using  Dragon voice recognition software and may include unintentional dictation errors.     Herschell Dimes, NP 09/20/21 2002    Georga Hacking, MD 10/09/21 [phone]

## 2021-09-20 NOTE — ED Provider Notes (Signed)
-----------------------------------------   8:02 PM on 09/20/2021 -----------------------------------------  Blood pressure (!) 158/75, pulse 79, temperature 98.7 F (37.1 C), temperature source Oral, resp. rate 18, height 5\' 8"  (1.727 m), weight 108.9 kg, SpO2 92 %.  Assuming care from , FNP.  In short, Natalie Gibson is a 61 y.o. female with a chief complaint of Leg Injury .  Refer to the original H&P for additional details.  The current plan of care is to await CT scan.  Patient presented to the ED after a mechanical fall after tripping over an item in her yard.  Patient has had left hip pain and inability to bear weight.  X-rays were negative, CT ordered for further evaluation.  Awaiting imaging at this time.46   ----------------------------------------- 11:27 PM on 09/20/2021 -----------------------------------------  Patient CT came back with a mildly displaced comminuted fracture of the posterior acetabulum.  Femoral head still in place.  There is no evidence of fracture to the head, surgical neck or femur.  Fracture appears to extend both superior and inferior to the head of the femur, given his presentation while most acetabular fractures are treated conservatively I reached out to our orthopedic surgeon for advice.  At this time, orthopedics recommends that I reach out to [facility] for consultation to determine whether they feel this should be repaired surgically.  This is not a surgery that our local orthopedics can provide if it does need surgery and needed to be transferred.  Patient is to remain nonweightbearing.  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  I have given her the precautions that this fracture could worsen, she could lose the ability to walk, her leg could give out underneath her due to the pain and she could fall and suffer further injuries.  Patient is to be nonweightbearing.   She is concerned that she needs to take care of her family member.  I have advised the patient to find any other family member or friends that could potentially care for her mother while I reached out to [facility].  Patient states that this is impossible at this time and that she is not going to be transferred or admitted.  I have advised the patient that this would be leaving AGAINST MEDICAL ADVICE.  She verbalizes understanding.  She is verbalized the risk of leaving AGAINST MEDICAL ADVICE.  I have advised the patient that at any time she can return.  I will provide crutches so she is nonweightbearing, provide pain medication for the patient as well.  Patient states that she will talk it over with family and friends and see if she can, with an alternative way of taking care of her mother but at this point she will be discharged tonight.  She will sign AMA paperwork prior to discharge.  Nursing staff is present to witness this conversation and has heard my concerns.  I have not reached out to [facility] at this time as the patient states that regardless of what [facility] says she would not be admitted or transferred.  If patient returns we will need to reach out to [facility] for potential transfer for this fracture.     [facility], PA-C 09/20/21 2359    11/20/21, MD 09/21/21 1057

## 2021-09-20 NOTE — ED Triage Notes (Signed)
Pt states she tripped over a rope and fell yesterday and c/o continued pain that is not relieved with OTC medication, and difficulty bearing weight today. Pt reports pain in left hip radiating down leg. Pt is AOX4, in NAD- no obvious deformity or swelling noted. Pt provided ice pack for area.

## 2022-03-03 DIAGNOSIS — H401123 Primary open-angle glaucoma, left eye, severe stage: Secondary | ICD-10-CM | POA: Diagnosis not present

## 2022-03-03 DIAGNOSIS — H401112 Primary open-angle glaucoma, right eye, moderate stage: Secondary | ICD-10-CM | POA: Diagnosis not present

## 2022-04-03 DIAGNOSIS — H2513 Age-related nuclear cataract, bilateral: Secondary | ICD-10-CM | POA: Diagnosis not present

## 2022-04-03 DIAGNOSIS — H401112 Primary open-angle glaucoma, right eye, moderate stage: Secondary | ICD-10-CM | POA: Diagnosis not present

## 2022-04-03 DIAGNOSIS — H401123 Primary open-angle glaucoma, left eye, severe stage: Secondary | ICD-10-CM | POA: Diagnosis not present

## 2022-07-06 DIAGNOSIS — H401112 Primary open-angle glaucoma, right eye, moderate stage: Secondary | ICD-10-CM | POA: Diagnosis not present

## 2022-07-06 DIAGNOSIS — H401123 Primary open-angle glaucoma, left eye, severe stage: Secondary | ICD-10-CM | POA: Diagnosis not present

## 2022-07-14 ENCOUNTER — Ambulatory Visit (INDEPENDENT_AMBULATORY_CARE_PROVIDER_SITE_OTHER): Payer: Medicaid Other | Admitting: Family Medicine

## 2022-07-14 ENCOUNTER — Encounter: Payer: Self-pay | Admitting: Family Medicine

## 2022-07-14 DIAGNOSIS — E785 Hyperlipidemia, unspecified: Secondary | ICD-10-CM

## 2022-07-14 DIAGNOSIS — Z114 Encounter for screening for human immunodeficiency virus [HIV]: Secondary | ICD-10-CM | POA: Diagnosis not present

## 2022-07-14 DIAGNOSIS — Z1231 Encounter for screening mammogram for malignant neoplasm of breast: Secondary | ICD-10-CM

## 2022-07-14 DIAGNOSIS — E1169 Type 2 diabetes mellitus with other specified complication: Secondary | ICD-10-CM | POA: Diagnosis not present

## 2022-07-14 DIAGNOSIS — E118 Type 2 diabetes mellitus with unspecified complications: Secondary | ICD-10-CM

## 2022-07-14 DIAGNOSIS — Z122 Encounter for screening for malignant neoplasm of respiratory organs: Secondary | ICD-10-CM | POA: Diagnosis not present

## 2022-07-14 DIAGNOSIS — Z1211 Encounter for screening for malignant neoplasm of colon: Secondary | ICD-10-CM

## 2022-07-14 DIAGNOSIS — Z23 Encounter for immunization: Secondary | ICD-10-CM

## 2022-07-14 DIAGNOSIS — H409 Unspecified glaucoma: Secondary | ICD-10-CM | POA: Diagnosis not present

## 2022-07-14 DIAGNOSIS — Z1159 Encounter for screening for other viral diseases: Secondary | ICD-10-CM | POA: Diagnosis not present

## 2022-07-14 DIAGNOSIS — E559 Vitamin D deficiency, unspecified: Secondary | ICD-10-CM

## 2022-07-14 NOTE — Progress Notes (Signed)
SUBJECTIVE:   Chief Complaint  Patient presents with   Establish Care   HPI Presents to clinic to establish care  No acute concerns today  Glaucoma Left eye.  Follows with [facility] clinic.  Currently  prescribed Prednisone 1 % and Latanoprost 0.005% eye drops.  Tobacco use 40 year pack history.  Postmenopausal LMP 5-6 yrs ago.  Not currently sexually active.  PERTINENT PMH / PSH: Glaucoma left eye Obesity Class 2 Tobacco use THC use   OBJECTIVE:  BP 128/68   Pulse 75   Temp 97.7 F (36.5 C)   Resp 16   Ht 5\' 9"  (1.753 m)   Wt 265 lb (120.2 kg)   SpO2 96%   BMI 39.13 kg/m    Physical Exam Vitals reviewed.  Constitutional:      General: She is not in acute distress.    Appearance: She is obese. She is not ill-appearing.  HENT:     Head: Normocephalic.     Right Ear: Tympanic membrane, ear canal and external ear normal.     Left Ear: Tympanic membrane, ear canal and external ear normal.     Nose: Nose normal.     Mouth/Throat:     Mouth: Mucous membranes are moist.  Eyes:     Extraocular Movements: Extraocular movements intact.     Conjunctiva/sclera: Conjunctivae normal.     Pupils: Pupils are equal, round, and reactive to light.  Neck:     Thyroid: No thyromegaly or thyroid tenderness.     Vascular: No carotid bruit.  Cardiovascular:     Rate and Rhythm: Normal rate and regular rhythm.     Pulses: Normal pulses.     Heart sounds: Normal heart sounds.  Pulmonary:     Effort: Pulmonary effort is normal.     Breath sounds: Normal breath sounds.  Abdominal:     General: Bowel sounds are normal. There is no distension.     Palpations: Abdomen is soft.     Tenderness: There is no abdominal tenderness. There is no right CVA tenderness, left CVA tenderness, guarding or rebound.  Musculoskeletal:        General: Normal range of motion.     Cervical back: Normal range of motion.     Right lower leg: No edema.     Left lower leg: No edema.   Lymphadenopathy:     Cervical: No cervical adenopathy.  Skin:    Capillary Refill: Capillary refill takes less than 2 seconds.  Neurological:     General: No focal deficit present.     Mental Status: She is alert and oriented to person, place, and time. Mental status is at baseline.     Motor: No weakness.  Psychiatric:        Mood and Affect: Mood normal.        Behavior: Behavior normal.        Thought Content: Thought content normal.        Judgment: Judgment normal.     ASSESSMENT/PLAN:  Morbid obesity (HCC) Assessment & Plan: Elevated BMI Check obesity labs today.  Orders: -     Hemoglobin A1c; Future -     CBC with Differential/Platelet; Future -     Lipid panel; Future -     VITAMIN D 25 Hydroxy (Vit-D Deficiency, Fractures); Future -     Vitamin B12; Future -     Comprehensive metabolic panel; Future -     TSH; Future  Glaucoma of left  eye, unspecified glaucoma type Assessment & Plan: Chronic.  Left eye. Continue Latanoprost and Pred Forte eye drops Follow up with [facility] as scheduled   Colon cancer screening -     Ambulatory referral to Gastroenterology  Breast cancer screening by mammogram -     3D Screening Mammogram, Left and Right; Future  Screening for lung cancer -     Ambulatory Referral for Lung Cancer Scre  Need for hepatitis C screening test -     Hepatitis C antibody; Future  Encounter for screening for HIV -     HIV Antibody (routine testing w rflx); Future  Need for pneumococcal 20-valent conjugate vaccination -     Pneumococcal conjugate vaccine 20-valent  Hyperlipidemia associated with type 2 diabetes mellitus (HCC) Assessment & Plan: New diagnosis LDL>100.  Goal <70 DM T2 Start Crestor 20 mg daily Encourage tobacco cessation Repeat fasting lipids annually    Type 2 diabetes with complication (HCC) Assessment & Plan: New diagnosis A1c 7.1 Start Metformin XR 500 mg daily BP controlled, consider ACEi/ARB in  future Consider statin Follows with [facility] Foot exam at next visit    Vitamin D deficiency Assessment & Plan: Start Vitamin D 1.25 mg weekly    PDMP reviewed  Return if symptoms worsen or fail to improve, for PCP.  Dana Allan, MD

## 2022-07-14 NOTE — Patient Instructions (Signed)
It was a pleasure meeting you today. Thank you for allowing me to take part in your health care.  Our goals for today as we discussed include:  Schedule lab appointment. Fast for 10 hours  Referral sent for Mammogram. Please call to schedule appointment. [facility] [address] [place], [place] [address] [phone]    Referral sent for Colonoscopy  Referral sent for Lung cancer screening  Recommend Tetanus Vaccination.  This is given every 10 years.   Recommend Shingles vaccine.  This is a 2 dose series and can be given at your local pharmacy.  Please talk to your pharmacist about this.   Received Pneumonia 20 vaccine today  Schedule PAP smear appointment   If you have any questions or concerns, please do not hesitate to call the office at [phone].  I look forward to our next visit and until then take care and stay safe.  Regards,   Dana Allan, MD   [facility]

## 2022-07-15 ENCOUNTER — Other Ambulatory Visit: Payer: Self-pay

## 2022-07-15 ENCOUNTER — Telehealth: Payer: Self-pay

## 2022-07-15 DIAGNOSIS — Z1211 Encounter for screening for malignant neoplasm of colon: Secondary | ICD-10-CM

## 2022-07-15 MED ORDER — NA SULFATE-K SULFATE-MG SULF 17.5-3.13-1.6 GM/177ML PO SOLN: 1.0000 | Freq: Once | ORAL | 0 refills | Status: AC

## 2022-07-15 NOTE — Telephone Encounter (Signed)
Gastroenterology Pre-Procedure Review  Request Date: 08/26/22 Requesting Physician: Dr. Allegra Lai  PATIENT REVIEW QUESTIONS: The patient responded to the following health history questions as indicated:    1. Are you having any GI issues? no 2. Do you have a personal history of Polyps? no 3. Do you have a family history of Colon Cancer or Polyps? no 4. Diabetes Mellitus? no 5. Joint replacements in the past 12 months?no 6. Major health problems in the past 3 months?no 7. Any artificial heart valves, MVP, or defibrillator?no    MEDICATIONS & ALLERGIES:    Patient reports the following regarding taking any anticoagulation/antiplatelet therapy:   Plavix, Coumadin, Eliquis, Xarelto, Lovenox, Pradaxa, Brilinta, or Effient? no Aspirin? no  Patient confirms/reports the following medications:  Current Outpatient Medications  Medication Sig Dispense Refill   dorzolamide (TRUSOPT) 2 % ophthalmic solution Place 1 drop into both eyes 2 (two) times daily.     latanoprost (XALATAN) 0.005 % ophthalmic solution Place 1 drop into both eyes at bedtime.     oxyCODONE-acetaminophen (PERCOCET/ROXICET) 5-325 MG tablet Take 1 tablet by mouth every 6 (six) hours as needed for severe pain. (Patient not taking: Reported on 07/14/2022) 20 tablet 0   prednisoLONE acetate (PRED FORTE) 1 % ophthalmic suspension Place 1 drop into the left eye 2 (two) times daily.     VYZULTA 0.024 % SOLN Apply 1 drop to eye at bedtime.     No current facility-administered medications for this visit.    Patient confirms/reports the following allergies:  No Known Allergies  No orders of the defined types were placed in this encounter.   AUTHORIZATION INFORMATION Primary Insurance: 1D#: Group #:  Secondary Insurance: 1D#: Group #:  SCHEDULE INFORMATION: Date: 08/26/22 Time: Location: [facility]

## 2022-07-22 ENCOUNTER — Other Ambulatory Visit (INDEPENDENT_AMBULATORY_CARE_PROVIDER_SITE_OTHER): Payer: Medicaid Other

## 2022-07-22 DIAGNOSIS — Z1159 Encounter for screening for other viral diseases: Secondary | ICD-10-CM

## 2022-07-22 DIAGNOSIS — Z114 Encounter for screening for human immunodeficiency virus [HIV]: Secondary | ICD-10-CM | POA: Diagnosis not present

## 2022-07-22 LAB — COMPREHENSIVE METABOLIC PANEL
ALT: 15 U/L (ref 0–35)
AST: 17 U/L (ref 0–37)
Albumin: 4.2 g/dL (ref 3.5–5.2)
Alkaline Phosphatase: 81 U/L (ref 39–117)
BUN: 13 mg/dL (ref 6–23)
CO2: 30 mEq/L (ref 19–32)
Calcium: 9.6 mg/dL (ref 8.4–10.5)
Chloride: 101 mEq/L (ref 96–112)
Creatinine, Ser: 0.75 mg/dL (ref 0.40–1.20)
GFR: 86.54 mL/min (ref >60.00)
Glucose, Bld: 124 mg/dL — ABNORMAL HIGH (ref 70–99)
Potassium: 4.2 mEq/L (ref 3.5–5.1)
Sodium: 139 mEq/L (ref 135–145)
Total Bilirubin: 0.4 mg/dL (ref 0.2–1.2)
Total Protein: 7.8 g/dL (ref 6.0–8.3)

## 2022-07-22 LAB — CBC WITH DIFFERENTIAL/PLATELET
Basophils Absolute: 0 10*3/uL (ref 0.0–0.1)
Basophils Relative: 0.8 % (ref 0.0–3.0)
Eosinophils Absolute: 0.2 10*3/uL (ref 0.0–0.7)
Eosinophils Relative: 2.5 % (ref 0.0–5.0)
HCT: 43 % (ref 36.0–46.0)
Hemoglobin: 14.4 g/dL (ref 12.0–15.0)
Lymphocytes Relative: 42.6 % (ref 12.0–46.0)
Lymphs Abs: 2.6 10*3/uL (ref 0.7–4.0)
MCHC: 33.4 g/dL (ref 30.0–36.0)
MCV: 94.8 fl (ref 78.0–100.0)
Monocytes Absolute: 0.8 10*3/uL (ref 0.1–1.0)
Monocytes Relative: 12.7 % — ABNORMAL HIGH (ref 3.0–12.0)
Neutro Abs: 2.5 10*3/uL (ref 1.4–7.7)
Neutrophils Relative %: 41.4 % — ABNORMAL LOW (ref 43.0–77.0)
Platelets: 250 10*3/uL (ref 150.0–400.0)
RBC: 4.53 Mil/uL (ref 3.87–5.11)
RDW: 13.7 % (ref 11.5–15.5)
WBC: 6.1 10*3/uL (ref 4.0–10.5)

## 2022-07-22 LAB — TSH: TSH: 1.66 u[IU]/mL (ref 0.35–5.50)

## 2022-07-22 LAB — LIPID PANEL
Cholesterol: 245 mg/dL — ABNORMAL HIGH (ref 0–200)
HDL: 39.3 mg/dL (ref >39.00)
NonHDL: 206.17
Total CHOL/HDL Ratio: 6
Triglycerides: 348 mg/dL — ABNORMAL HIGH (ref 0.0–149.0)
VLDL: 69.6 mg/dL — ABNORMAL HIGH (ref 0.0–40.0)

## 2022-07-22 LAB — VITAMIN D 25 HYDROXY (VIT D DEFICIENCY, FRACTURES): VITD: 19.4 ng/mL — ABNORMAL LOW (ref 30.00–100.00)

## 2022-07-22 LAB — VITAMIN B12: Vitamin B-12: 681 pg/mL (ref 211–911)

## 2022-07-22 LAB — LDL CHOLESTEROL, DIRECT: Direct LDL: 119 mg/dL

## 2022-07-22 LAB — HEMOGLOBIN A1C: Hgb A1c MFr Bld: 7.1 % — ABNORMAL HIGH (ref 4.6–6.5)

## 2022-07-23 LAB — HEPATITIS C ANTIBODY: Hepatitis C Ab: NONREACTIVE

## 2022-07-23 LAB — HIV ANTIBODY (ROUTINE TESTING W REFLEX): HIV 1&2 Ab, 4th Generation: NONREACTIVE

## 2022-07-27 ENCOUNTER — Other Ambulatory Visit: Payer: Self-pay | Admitting: Family Medicine

## 2022-07-27 DIAGNOSIS — E118 Type 2 diabetes mellitus with unspecified complications: Secondary | ICD-10-CM

## 2022-07-27 DIAGNOSIS — E1169 Type 2 diabetes mellitus with other specified complication: Secondary | ICD-10-CM

## 2022-07-27 DIAGNOSIS — H401112 Primary open-angle glaucoma, right eye, moderate stage: Secondary | ICD-10-CM | POA: Diagnosis not present

## 2022-07-27 DIAGNOSIS — H401123 Primary open-angle glaucoma, left eye, severe stage: Secondary | ICD-10-CM | POA: Diagnosis not present

## 2022-07-27 DIAGNOSIS — E559 Vitamin D deficiency, unspecified: Secondary | ICD-10-CM

## 2022-07-27 MED ORDER — ROSUVASTATIN CALCIUM 20 MG PO TABS
20.0000 mg | ORAL_TABLET | Freq: Every day | ORAL | 1 refills | Status: DC
Start: 2022-07-27 — End: 2022-10-26

## 2022-07-27 MED ORDER — VITAMIN D (ERGOCALCIFEROL) 1.25 MG (50000 UNIT) PO CAPS
50000.0000 [IU] | ORAL_CAPSULE | ORAL | 1 refills | Status: AC
Start: 2022-07-27 — End: ?

## 2022-07-27 MED ORDER — METFORMIN HCL ER 500 MG PO TB24
500.0000 mg | ORAL_TABLET | Freq: Every day | ORAL | 3 refills | Status: DC
Start: 2022-07-27 — End: 2022-10-29

## 2022-08-12 ENCOUNTER — Encounter: Payer: Self-pay | Admitting: Family Medicine

## 2022-08-12 DIAGNOSIS — E1169 Type 2 diabetes mellitus with other specified complication: Secondary | ICD-10-CM | POA: Insufficient documentation

## 2022-08-12 DIAGNOSIS — Z1211 Encounter for screening for malignant neoplasm of colon: Secondary | ICD-10-CM | POA: Insufficient documentation

## 2022-08-12 DIAGNOSIS — E118 Type 2 diabetes mellitus with unspecified complications: Secondary | ICD-10-CM | POA: Insufficient documentation

## 2022-08-12 DIAGNOSIS — Z122 Encounter for screening for malignant neoplasm of respiratory organs: Secondary | ICD-10-CM | POA: Insufficient documentation

## 2022-08-12 DIAGNOSIS — Z114 Encounter for screening for human immunodeficiency virus [HIV]: Secondary | ICD-10-CM | POA: Insufficient documentation

## 2022-08-12 DIAGNOSIS — E559 Vitamin D deficiency, unspecified: Secondary | ICD-10-CM

## 2022-08-12 DIAGNOSIS — Z1159 Encounter for screening for other viral diseases: Secondary | ICD-10-CM | POA: Insufficient documentation

## 2022-08-12 DIAGNOSIS — H409 Unspecified glaucoma: Secondary | ICD-10-CM | POA: Insufficient documentation

## 2022-08-12 DIAGNOSIS — Z23 Encounter for immunization: Secondary | ICD-10-CM | POA: Insufficient documentation

## 2022-08-12 DIAGNOSIS — Z1231 Encounter for screening mammogram for malignant neoplasm of breast: Secondary | ICD-10-CM | POA: Insufficient documentation

## 2022-08-12 HISTORY — DX: Vitamin D deficiency, unspecified: E55.9

## 2022-08-12 NOTE — Assessment & Plan Note (Signed)
Chronic.  Left eye. Continue Latanoprost and Pred Forte eye drops Follow up with [facility] as scheduled

## 2022-08-12 NOTE — Assessment & Plan Note (Signed)
Start Vitamin D 1.25 mg weekly

## 2022-08-12 NOTE — Assessment & Plan Note (Addendum)
New diagnosis LDL>100.  Goal <70 DM T2 Start Crestor 20 mg daily Encourage tobacco cessation Repeat fasting lipids annually

## 2022-08-12 NOTE — Assessment & Plan Note (Signed)
New diagnosis A1c 7.1 Start Metformin XR 500 mg daily BP controlled, consider ACEi/ARB in future Consider statin Follows with [facility] Foot exam at next visit

## 2022-08-12 NOTE — Assessment & Plan Note (Signed)
Elevated BMI Check obesity labs today.

## 2022-08-24 ENCOUNTER — Encounter: Payer: Self-pay | Admitting: Gastroenterology

## 2022-08-26 ENCOUNTER — Ambulatory Visit: Payer: Medicaid Other | Admitting: Certified Registered Nurse Anesthetist

## 2022-08-26 ENCOUNTER — Encounter
Admission: RE | Disposition: A | Discharge status: Home / Self Care | Payer: Self-pay | Source: Home / Self Care | Attending: Gastroenterology

## 2022-08-26 ENCOUNTER — Ambulatory Visit
Admission: RE | Admit: 2022-08-26 | Discharge: 2022-08-26 | Disposition: A | Discharge status: Home / Self Care | Payer: Medicaid Other | Attending: Gastroenterology | Admitting: Gastroenterology

## 2022-08-26 ENCOUNTER — Encounter: Payer: Self-pay | Admitting: Gastroenterology

## 2022-08-26 DIAGNOSIS — Z1211 Encounter for screening for malignant neoplasm of colon: Secondary | ICD-10-CM

## 2022-08-26 DIAGNOSIS — E119 Type 2 diabetes mellitus without complications: Secondary | ICD-10-CM | POA: Insufficient documentation

## 2022-08-26 DIAGNOSIS — K5730 Diverticulosis of large intestine without perforation or abscess without bleeding: Secondary | ICD-10-CM | POA: Insufficient documentation

## 2022-08-26 DIAGNOSIS — F17210 Nicotine dependence, cigarettes, uncomplicated: Secondary | ICD-10-CM | POA: Diagnosis not present

## 2022-08-26 DIAGNOSIS — Z7984 Long term (current) use of oral hypoglycemic drugs: Secondary | ICD-10-CM | POA: Insufficient documentation

## 2022-08-26 DIAGNOSIS — K644 Residual hemorrhoidal skin tags: Secondary | ICD-10-CM | POA: Insufficient documentation

## 2022-08-26 DIAGNOSIS — K649 Unspecified hemorrhoids: Secondary | ICD-10-CM | POA: Diagnosis not present

## 2022-08-26 HISTORY — PX: COLONOSCOPY WITH PROPOFOL: SHX5780

## 2022-08-26 HISTORY — DX: Type 2 diabetes mellitus without complications: E11.9

## 2022-08-26 SURGERY — COLONOSCOPY WITH PROPOFOL
Anesthesia: General

## 2022-08-26 MED ORDER — SODIUM CHLORIDE 0.9 % IV SOLN
INTRAVENOUS | Status: DC
  Administered 2022-08-26: 1000 mL via INTRAVENOUS

## 2022-08-26 MED ORDER — PROPOFOL 10 MG/ML IV BOLUS
INTRAVENOUS | Status: DC | PRN
  Administered 2022-08-26: 100 mg via INTRAVENOUS

## 2022-08-26 MED ORDER — PROPOFOL 500 MG/50ML IV EMUL
INTRAVENOUS | Status: DC | PRN
  Administered 2022-08-26: 150 ug/kg/min via INTRAVENOUS

## 2022-08-26 MED ORDER — LIDOCAINE HCL (CARDIAC) PF 100 MG/5ML IV SOSY
PREFILLED_SYRINGE | INTRAVENOUS | Status: DC | PRN
  Administered 2022-08-26: 50 mg via INTRAVENOUS

## 2022-08-26 NOTE — Anesthesia Preprocedure Evaluation (Signed)
Anesthesia Evaluation  Patient identified by MRN, date of birth, ID band Patient awake    Reviewed: Allergy & Precautions, H&P , NPO status , Patient's Chart, lab work & pertinent test results, reviewed documented beta blocker date and time   Airway Mallampati: II   Neck ROM: full    Dental  (+) Poor Dentition   Pulmonary neg pulmonary ROS, Current Smoker   Pulmonary exam normal        Cardiovascular Exercise Tolerance: Good negative cardio ROS Normal cardiovascular exam Rhythm:regular Rate:Normal     Neuro/Psych  Headaches  negative psych ROS   GI/Hepatic negative GI ROS, Neg liver ROS,,,  Endo/Other  negative endocrine ROSdiabetes    Renal/GU negative Renal ROS  negative genitourinary   Musculoskeletal   Abdominal   Peds  Hematology negative hematology ROS (+)   Anesthesia Other Findings Past Medical History: No date: Diabetes mellitus without complication (HCC) No date: Glaucoma No date: Migraine History reviewed. No pertinent surgical history. BMI    Body Mass Index: 40.93 kg/m     Reproductive/Obstetrics negative OB ROS                             Anesthesia Physical Anesthesia Plan  ASA: 2  Anesthesia Plan: General   Post-op Pain Management:    Induction:   PONV Risk Score and Plan:   Airway Management Planned:   Additional Equipment:   Intra-op Plan:   Post-operative Plan:   Informed Consent: I have reviewed the patients History and Physical, chart, labs and discussed the procedure including the risks, benefits and alternatives for the proposed anesthesia with the patient or authorized representative who has indicated his/her understanding and acceptance.     Dental Advisory Given  Plan Discussed with: CRNA  Anesthesia Plan Comments:        Anesthesia Quick Evaluation

## 2022-08-26 NOTE — Transfer of Care (Signed)
Immediate Anesthesia Transfer of Care Note  Patient: Henderson Newcomer  Procedure(s) Performed: COLONOSCOPY WITH PROPOFOL  Patient Location:  Endoscopy Unit Anesthesia Type:General  Level of Consciousness: awake and alert   Airway & Oxygen Therapy: Patient Spontanous Breathing  Post-op Assessment: Report given to RN and Post -op Vital signs reviewed and stable  Post vital signs: Reviewed and stable  Last Vitals:  Vitals Value Taken Time  BP 96/58 08/26/22 1123  Temp 35.7 C 08/26/22 1123  Pulse 67 08/26/22 1124  Resp 17 08/26/22 1124  SpO2 94 % 08/26/22 1124  Vitals shown include unfiled device data.  Last Pain:  Vitals:   08/26/22 1123  TempSrc: Tympanic  PainSc: Asleep         Complications: No notable events documented.

## 2022-08-26 NOTE — Op Note (Signed)
[facility] Patient Name: Natalie Gibson Procedure Date: 08/26/2022 10:37 AM MRN: [id number] Account #: [id number] Date of Birth: [birth year] Admit Type: Outpatient Age: 62 Room: [facility] ENDO ROOM 4 Gender: Female Note Status: Finalized Instrument Name: Nelda Marseille [id number] Procedure:             Colonoscopy Indications:           Screening for colorectal malignant neoplasm Providers:             Toney Reil MD, MD Referring MD:          Toney Reil MD, MD (Referring MD), Glenard Haring.                         Clent Ridges (Referring MD) Medicines:             General Anesthesia Complications:         No immediate complications. Estimated blood loss: None. Procedure:             Pre-Anesthesia Assessment:                        - Prior to the procedure, a History and Physical was                         performed, and patient medications and allergies were                         reviewed. The patient is competent. The risks and                         benefits of the procedure and the sedation options and                         risks were discussed with the patient. All questions                         were answered and informed consent was obtained.                         Patient identification and proposed procedure were                         verified by the physician, the nurse, the                         anesthesiologist, the anesthetist and the technician                         in the pre-procedure area in the procedure room in the                         endoscopy suite. Mental Status Examination: alert and                         oriented. Airway Examination: normal oropharyngeal                         airway and neck mobility. Respiratory Examination:  clear to auscultation. CV Examination: normal.                         Prophylactic Antibiotics: The patient does not require                         prophylactic  antibiotics. Prior Anticoagulants: The                         patient has taken no anticoagulant or antiplatelet                         agents. ASA Grade Assessment: II - A patient with mild                         systemic disease. After reviewing the risks and                         benefits, the patient was deemed in satisfactory                         condition to undergo the procedure. The anesthesia                         plan was to use general anesthesia. Immediately prior                         to administration of medications, the patient was                         re-assessed for adequacy to receive sedatives. The                         heart rate, respiratory rate, oxygen saturations,                         blood pressure, adequacy of pulmonary ventilation, and                         response to care were monitored throughout the                         procedure. The physical status of the patient was                         re-assessed after the procedure.                        After obtaining informed consent, the colonoscope was                         passed under direct vision. Throughout the procedure,                         the patient's blood pressure, pulse, and oxygen                         saturations were monitored continuously. The  Colonoscope was introduced through the anus and                         advanced to the the cecum, identified by appendiceal                         orifice and ileocecal valve. The colonoscopy was                         performed with moderate difficulty due to the                         patient's respiratory instability (hypoxia).                         Successful completion of the procedure was aided by                         performing chin lift, administering oxygen and                         treating with ventilation. The patient tolerated the                         procedure well. The  quality of the bowel preparation                         was evaluated using the BBPS [facility] Bowel Preparation                         Scale) with scores of: Right Colon = 3, Transverse                         Colon = 3 and Left Colon = 3 (entire mucosa seen well                         with no residual staining, small fragments of stool or                         opaque liquid). The total BBPS score equals 9. The                         ileocecal valve, appendiceal orifice, and rectum were                         photographed. Findings:      The perianal and digital rectal examinations were normal. Pertinent       negatives include normal sphincter tone and no palpable rectal lesions.      Multiple large-mouthed diverticula were found in the recto-sigmoid colon       and sigmoid colon.      Non-bleeding external hemorrhoids were found during retroflexion. The       hemorrhoids were large. Impression:            - Diverticulosis in the recto-sigmoid colon and in the                         sigmoid  colon.                        - Non-bleeding external hemorrhoids.                        - No specimens collected. Recommendation:        - Discharge patient to home (with escort).                        - Resume previous diet today.                        - Continue present medications.                        - Repeat colonoscopy in 10 years for screening                         purposes. Procedure Code(s):     --- Professional ---                        W4132, Colorectal cancer screening; colonoscopy on                         individual not meeting criteria for high risk Diagnosis Code(s):     --- Professional ---                        Z12.11, Encounter for screening for malignant neoplasm                         of colon                        K57.30, Diverticulosis of large intestine without                         perforation or abscess without bleeding CPT copyright 2022 American  Medical Association. All rights reserved. The codes documented in this report are preliminary and upon coder review may  be revised to meet current compliance requirements. Dr. Libby Maw Toney Reil MD, MD 08/26/2022 11:20:25 AM This report has been signed electronically. Number of Addenda: 0 Note Initiated On: 08/26/2022 10:37 AM Scope Withdrawal Time: 0 hours 10 minutes 0 seconds  Total Procedure Duration: 0 hours 19 minutes 0 seconds  Estimated Blood Loss:  Estimated blood loss: none.      [facility]

## 2022-08-26 NOTE — H&P (Signed)
Arlyss Repress, MD [address]  [address]  [place], [place] [address]  Main: [phone]  Fax: [phone] Pager: [phone]  Primary Care Physician:  Dana Allan, MD Primary Gastroenterologist:  Dr. Arlyss Repress  Pre-Procedure History & Physical: HPI:  Natalie Gibson is a 62 y.o. female is here for an colonoscopy.   Past Medical History:  Diagnosis Date   Diabetes mellitus without complication (HCC)    Glaucoma    Migraine     History reviewed. No pertinent surgical history.  Prior to Admission medications   Medication Sig Start Date End Date Taking? Authorizing Provider  dorzolamide (TRUSOPT) 2 % ophthalmic solution Place 1 drop into both eyes 2 (two) times daily. 09/02/21  Yes [provider]  latanoprost (XALATAN) 0.005 % ophthalmic solution Place 1 drop into both eyes at bedtime. 09/02/21  Yes [provider]  metFORMIN (GLUCOPHAGE-XR) 500 MG 24 hr tablet Take 1 tablet (500 mg total) by mouth daily with breakfast. 07/27/22  Yes Dana Allan, MD  prednisoLONE acetate (PRED FORTE) 1 % ophthalmic suspension Place 1 drop into the left eye 2 (two) times daily. 08/28/21  Yes [provider]  rosuvastatin (CRESTOR) 20 MG tablet Take 1 tablet (20 mg total) by mouth daily. 07/27/22  Yes Dana Allan, MD  Vitamin D, Ergocalciferol, (DRISDOL) 1.25 MG (50000 UNIT) CAPS capsule Take 1 capsule (50,000 Units total) by mouth every 7 (seven) days. 07/27/22  Yes Dana Allan, MD  VYZULTA 0.024 % SOLN Apply 1 drop to eye at bedtime. 03/03/22  Yes [provider]    Allergies as of 07/15/2022   (No Known Allergies)    Family History  Problem Relation Age of Onset   Stroke Mother    Kidney disease Mother    Hypertension Mother    Diabetes Mother    Arthritis Mother     Social History   Socioeconomic History   Marital status: Single    Spouse name: Not on file   Number of children: Not on file   Years of education: Not on file   Highest  education level: Not on file  Occupational History   Not on file  Tobacco Use   Smoking status: Every Day    Current packs/day: 1.00    Types: Cigarettes   Smokeless tobacco: Never  Vaping Use   Vaping status: Never Used  Substance and Sexual Activity   Alcohol use: Not Currently   Drug use: Yes    Types: Marijuana    Comment: used on Monday   Sexual activity: Not on file  Other Topics Concern   Not on file  Social History Narrative   Not on file   Social Determinants of Health   Financial Resource Strain: Not on file  Food Insecurity: Not on file  Transportation Needs: Not on file  Physical Activity: Not on file  Stress: Not on file  Social Connections: Not on file  Intimate Partner Violence: Not on file    Review of Systems: See HPI, otherwise negative ROS  Physical Exam: BP (!) 148/84   Pulse 81   Temp (!) 96.4 F (35.8 C) (Temporal)   Resp 20   Ht 5\' 7"  (1.702 m)   Wt 118.5 kg   SpO2 93%   BMI 40.93 kg/m  General:   Alert,  pleasant and cooperative in NAD Head:  Normocephalic and atraumatic. Neck:  Supple; no masses or thyromegaly. Lungs:  Clear throughout to auscultation.    Heart:  Regular  rate and rhythm. Abdomen:  Soft, nontender and nondistended. Normal bowel sounds, without guarding, and without rebound.   Neurologic:  Alert and  oriented x4;  grossly normal neurologically.  Impression/Plan: Natalie Gibson is here for an colonoscopy to be performed for colon cancer screening  Risks, benefits, limitations, and alternatives regarding  colonoscopy have been reviewed with the patient.  Questions have been answered.  All parties agreeable.   Lannette Donath, MD  08/26/2022, 10:08 AM

## 2022-08-26 NOTE — Anesthesia Procedure Notes (Signed)
Date/Time: 08/26/2022 10:55 AM  Performed by: Ginger Carne, CRNAPre-anesthesia Checklist: Patient identified, Emergency Drugs available, Suction available, Patient being monitored and Timeout performed Patient Re-evaluated:Patient Re-evaluated prior to induction Oxygen Delivery Method: Nasal cannula Preoxygenation: Pre-oxygenation with 100% oxygen Induction Type: IV induction

## 2022-08-27 ENCOUNTER — Encounter: Payer: Self-pay | Admitting: Gastroenterology

## 2022-08-27 NOTE — Anesthesia Postprocedure Evaluation (Signed)
Anesthesia Post Note  Patient: Natalie Gibson  Procedure(s) Performed: COLONOSCOPY WITH PROPOFOL  Patient location during evaluation: PACU Anesthesia Type: General Level of consciousness: awake and alert Pain management: pain level controlled Vital Signs Assessment: post-procedure vital signs reviewed and stable Respiratory status: spontaneous breathing, nonlabored ventilation, respiratory function stable and patient connected to nasal cannula oxygen Cardiovascular status: blood pressure returned to baseline and stable Postop Assessment: no apparent nausea or vomiting Anesthetic complications: no   No notable events documented.   Last Vitals:  Vitals:   08/26/22 1133 08/26/22 1143  BP: 126/86 117/66  Pulse: (!) 58 (!) 58  Resp: 17 12  Temp:    SpO2: 95% 98%    Last Pain:  Vitals:   08/27/22 0847  TempSrc:   PainSc: 0-No pain                 Yevette Edwards

## 2022-09-08 DIAGNOSIS — H269 Unspecified cataract: Secondary | ICD-10-CM | POA: Diagnosis not present

## 2022-09-08 DIAGNOSIS — H2511 Age-related nuclear cataract, right eye: Secondary | ICD-10-CM | POA: Diagnosis not present

## 2022-09-08 DIAGNOSIS — H401133 Primary open-angle glaucoma, bilateral, severe stage: Secondary | ICD-10-CM | POA: Diagnosis not present

## 2022-09-12 ENCOUNTER — Emergency Department (HOSPITAL_COMMUNITY): Payer: Medicaid Other

## 2022-09-12 ENCOUNTER — Other Ambulatory Visit: Payer: Self-pay

## 2022-09-12 ENCOUNTER — Encounter (HOSPITAL_COMMUNITY): Payer: Self-pay

## 2022-09-12 ENCOUNTER — Emergency Department (HOSPITAL_COMMUNITY)
Admission: EM | Admit: 2022-09-12 | Discharge: 2022-09-12 | Disposition: A | Discharge status: Home / Self Care | Payer: Medicaid Other | Attending: Emergency Medicine | Admitting: Emergency Medicine

## 2022-09-12 DIAGNOSIS — R457 State of emotional shock and stress, unspecified: Secondary | ICD-10-CM | POA: Diagnosis not present

## 2022-09-12 DIAGNOSIS — Z7984 Long term (current) use of oral hypoglycemic drugs: Secondary | ICD-10-CM | POA: Diagnosis not present

## 2022-09-12 DIAGNOSIS — R000 Tachycardia, unspecified: Secondary | ICD-10-CM | POA: Diagnosis not present

## 2022-09-12 DIAGNOSIS — Z1152 Encounter for screening for COVID-19: Secondary | ICD-10-CM | POA: Insufficient documentation

## 2022-09-12 DIAGNOSIS — R519 Headache, unspecified: Secondary | ICD-10-CM | POA: Diagnosis not present

## 2022-09-12 DIAGNOSIS — R0789 Other chest pain: Secondary | ICD-10-CM | POA: Insufficient documentation

## 2022-09-12 DIAGNOSIS — I10 Essential (primary) hypertension: Secondary | ICD-10-CM | POA: Diagnosis not present

## 2022-09-12 DIAGNOSIS — E119 Type 2 diabetes mellitus without complications: Secondary | ICD-10-CM | POA: Diagnosis not present

## 2022-09-12 DIAGNOSIS — F419 Anxiety disorder, unspecified: Secondary | ICD-10-CM | POA: Insufficient documentation

## 2022-09-12 DIAGNOSIS — R350 Frequency of micturition: Secondary | ICD-10-CM | POA: Insufficient documentation

## 2022-09-12 DIAGNOSIS — R064 Hyperventilation: Secondary | ICD-10-CM | POA: Diagnosis not present

## 2022-09-12 DIAGNOSIS — R0689 Other abnormalities of breathing: Secondary | ICD-10-CM | POA: Diagnosis not present

## 2022-09-12 LAB — CBC WITH DIFFERENTIAL/PLATELET
Abs Immature Granulocytes: 0.01 10*3/uL (ref 0.00–0.07)
Basophils Absolute: 0 10*3/uL (ref 0.0–0.1)
Basophils Relative: 1 %
Eosinophils Absolute: 0.1 10*3/uL (ref 0.0–0.5)
Eosinophils Relative: 1 %
HCT: 42.5 % (ref 36.0–46.0)
Hemoglobin: 14.3 g/dL (ref 12.0–15.0)
Immature Granulocytes: 0 %
Lymphocytes Relative: 29 %
Lymphs Abs: 2 10*3/uL (ref 0.7–4.0)
MCH: 30.8 pg (ref 26.0–34.0)
MCHC: 33.6 g/dL (ref 30.0–36.0)
MCV: 91.4 fL (ref 80.0–100.0)
Monocytes Absolute: 0.3 10*3/uL (ref 0.1–1.0)
Monocytes Relative: 5 %
Neutro Abs: 4.5 10*3/uL (ref 1.7–7.7)
Neutrophils Relative %: 64 %
Platelets: 251 10*3/uL (ref 150–400)
RBC: 4.65 MIL/uL (ref 3.87–5.11)
RDW: 12.5 % (ref 11.5–15.5)
WBC: 7 10*3/uL (ref 4.0–10.5)
nRBC: 0 % (ref 0.0–0.2)

## 2022-09-12 LAB — BASIC METABOLIC PANEL
Anion gap: 13 (ref 5–15)
BUN: 9 mg/dL (ref 6–20)
CO2: 25 mmol/L (ref 22–32)
Calcium: 9.2 mg/dL (ref 8.9–10.3)
Chloride: 102 mmol/L (ref 98–111)
Creatinine, Ser: 0.7 mg/dL (ref 0.44–1.00)
GFR, Estimated: >60 mL/min (ref >60)
Glucose, Bld: 123 mg/dL — ABNORMAL HIGH (ref 70–99)
Potassium: 3.9 mmol/L (ref 3.5–5.1)
Sodium: 140 mmol/L (ref 135–145)

## 2022-09-12 LAB — URINALYSIS, ROUTINE W REFLEX MICROSCOPIC
Bilirubin Urine: NEGATIVE
Glucose, UA: NEGATIVE mg/dL
Hgb urine dipstick: NEGATIVE
Ketones, ur: NEGATIVE mg/dL
Leukocytes,Ua: NEGATIVE
Nitrite: NEGATIVE
Protein, ur: 30 mg/dL — AB
Specific Gravity, Urine: 1.025 (ref 1.005–1.030)
pH: 5 (ref 5.0–8.0)

## 2022-09-12 LAB — TROPONIN I (HIGH SENSITIVITY): Troponin I (High Sensitivity): 4 ng/L (ref <18)

## 2022-09-12 LAB — SARS CORONAVIRUS 2 BY RT PCR: SARS Coronavirus 2 by RT PCR: NEGATIVE

## 2022-09-12 LAB — ACETAMINOPHEN LEVEL: Acetaminophen (Tylenol), Serum: <10 ug/mL — ABNORMAL LOW (ref 10–30)

## 2022-09-12 MED ORDER — PROCHLORPERAZINE EDISYLATE 10 MG/2ML IJ SOLN
10.0000 mg | Freq: Once | INTRAMUSCULAR | Status: AC
  Administered 2022-09-12: 10 mg via INTRAVENOUS
  Filled 2022-09-12: qty 2

## 2022-09-12 MED ORDER — DIPHENHYDRAMINE HCL 50 MG/ML IJ SOLN
25.0000 mg | Freq: Once | INTRAMUSCULAR | Status: AC
  Administered 2022-09-12: 25 mg via INTRAVENOUS
  Filled 2022-09-12: qty 1

## 2022-09-12 MED ORDER — SODIUM CHLORIDE 0.9 % IV BOLUS
1000.0000 mL | Freq: Once | INTRAVENOUS | Status: AC
  Administered 2022-09-12: 1000 mL via INTRAVENOUS

## 2022-09-12 MED ORDER — KETOROLAC TROMETHAMINE 15 MG/ML IJ SOLN
15.0000 mg | Freq: Once | INTRAMUSCULAR | Status: AC
  Administered 2022-09-12: 15 mg via INTRAVENOUS
  Filled 2022-09-12: qty 1

## 2022-09-12 NOTE — ED Notes (Signed)
RN called pt nephew, TJ, to pick up pt. Pt was provided d/c paperwork and transported to lobby.

## 2022-09-12 NOTE — ED Notes (Signed)
Pt says she doesn't have hx of anxiety but she feels overwhelmed and can't breath. These symptoms started within 4 days. 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 Pt says right eye has cataract and surgery was done this past Tuesday. Pt says her vision in her right eye is blurry and the provider told her it will take 5-12 days for vision to improve. Pt says she took 10-12 500 mg Tylenol for pain yesterday and scared she consumed too much. Pt says her urine has a smell, painful, burning, and increase frequency since yesterday.

## 2022-09-12 NOTE — ED Triage Notes (Signed)
Pt bib ems from home c/o eye blindness. Pt had cataract surgery this past Tuesday and hasn't been able to see since. Pt started having burning and pain when urinating that started 8/30. Pt denies fever, N/V. Hx HTN DM2  BP 162/98 HR 102 CBG 148

## 2022-09-12 NOTE — ED Notes (Signed)
Pt requested RN to call nephew to inform him to feed her mother. Pt is currently speaking to nephew on the phone.

## 2022-09-12 NOTE — ED Notes (Signed)
Pt u/t complete visual acuity. She says she cannot see anything. EDP aware

## 2022-09-12 NOTE — Discharge Instructions (Addendum)
Thank you for letting us take care of you today.   Your heart enzymes were normal.  Your EKG did not show evidence of a heart attack  Your chest x-ray was also normal.  We do not see signs of a heart attack or infection such as pneumonia.  Your urine also did not appear infected.  Follow-up with your ophthalmologist as scheduled on Wednesday.  We gave you medications to treat your headache.  You should not drive for approximately 6 hours after receiving these medications.  If you continue to have severe headaches, follow-up with your PCP.  For new symptoms or worsening condition, return to the ED for reevaluation.

## 2022-09-12 NOTE — ED Provider Notes (Signed)
[place] EMERGENCY DEPARTMENT AT [facility] Provider Note   CSN: [id number] Arrival date & time: 09/12/22  6578     History  Chief Complaint  Patient presents with   Eye Problem    Natalie Gibson is a 62 y.o. female with past medical history diabetes who presents to the ED with multiple complaints.  Patient is status post cataract surgery 4 days ago.  States that she was evaluated for a complication following this and told that she will have difficulty with her vision for 5 to 12 days.  She is scheduled for follow-up this upcoming Wednesday.  She tells me that her vision is unchanged from her recent follow-up and she has no complaints related to her vision today.  She tells me that she came to the ED as she was having difficulty with her breathing and thought that it may be anxiety but was also concern for heart attack.  No history of CAD.  No actual pain in the chest.  States that she has had episodes where her breathing is really fast.  Also complains of a severe generalized headache.  Has a history of migraines.  No fall or head injury.  States that the headache was so bad she took ten 500 mg Tylenol yesterday and is afraid she took too many.  Also complains of urinary frequency that started last night.  No recent antibiotics or diagnosed UTIs.  No abdominal pain, nausea, or vomiting.  No hematuria or back pain.       Home Medications Prior to Admission medications   Medication Sig Start Date End Date Taking? Authorizing Provider  dorzolamide (TRUSOPT) 2 % ophthalmic solution Place 1 drop into both eyes 2 (two) times daily. 09/02/21   [provider]  latanoprost (XALATAN) 0.005 % ophthalmic solution Place 1 drop into both eyes at bedtime. 09/02/21   [provider]  metFORMIN (GLUCOPHAGE-XR) 500 MG 24 hr tablet Take 1 tablet (500 mg total) by mouth daily with breakfast. 07/27/22   Dana Allan, MD  prednisoLONE acetate (PRED FORTE) 1 % ophthalmic  suspension Place 1 drop into the left eye 2 (two) times daily. 08/28/21   [provider]  rosuvastatin (CRESTOR) 20 MG tablet Take 1 tablet (20 mg total) by mouth daily. 07/27/22   Dana Allan, MD  Vitamin D, Ergocalciferol, (DRISDOL) 1.25 MG (50000 UNIT) CAPS capsule Take 1 capsule (50,000 Units total) by mouth every 7 (seven) days. 07/27/22   Dana Allan, MD  VYZULTA 0.024 % SOLN Apply 1 drop to eye at bedtime. 03/03/22   [provider]      Allergies    Patient has no known allergies.    Review of Systems   Review of Systems  All other systems reviewed and are negative.   Physical Exam Updated Vital Signs BP (!) 107/56   Pulse (!) 57   Temp (!) 97.5 F (36.4 C) (Oral)   Resp 18   Ht 5\' 7"  (1.702 m)   Wt 119.7 kg   SpO2 97%   BMI 41.35 kg/m  Physical Exam Vitals and nursing note reviewed.  Constitutional:      Appearance: She is not ill-appearing, toxic-appearing or diaphoretic.     Comments: Significantly anxious  HENT:     Head: Normocephalic and atraumatic.     Mouth/Throat:     Mouth: Mucous membranes are moist.  Eyes:     General: Lids are normal.     Comments: Right pupil reactive, eomi, left  opacified  Cardiovascular:     Rate and Rhythm: Normal rate and regular rhythm.  Pulmonary:     Effort: Pulmonary effort is normal. No respiratory distress.     Breath sounds: Normal breath sounds. No stridor. No wheezing, rhonchi or rales.  Abdominal:     General: Abdomen is flat. There is no distension.     Palpations: Abdomen is soft.     Tenderness: There is no abdominal tenderness. There is no guarding or rebound.  Musculoskeletal:        General: No deformity.     Cervical back: Normal range of motion and neck supple.  Neurological:     General: No focal deficit present.     Mental Status: She is alert and oriented to person, place, and time.     Cranial Nerves: Cranial nerves 2-12 are intact.     Motor: Motor function is intact.   Psychiatric:        Mood and Affect: Mood is anxious.        Behavior: Behavior is cooperative.     ED Results / Procedures / Treatments   Labs (all labs ordered are listed, but only abnormal results are displayed) Labs Reviewed  URINALYSIS, ROUTINE W REFLEX MICROSCOPIC - Abnormal; Notable for the following components:      Result Value   APPearance HAZY (*)    Protein, ur 30 (*)    Bacteria, UA RARE (*)    All other components within normal limits  ACETAMINOPHEN LEVEL - Abnormal; Notable for the following components:   Acetaminophen (Tylenol), Serum <10 (*)    All other components within normal limits  BASIC METABOLIC PANEL - Abnormal; Notable for the following components:   Glucose, Bld 123 (*)    All other components within normal limits  SARS CORONAVIRUS 2 BY RT PCR  CBC WITH DIFFERENTIAL/PLATELET  TROPONIN I (HIGH SENSITIVITY)    EKG None  Radiology DG Chest 2 View  Result Date: 09/12/2022 CLINICAL DATA:  sob EXAM: CHEST - 2 VIEW COMPARISON:  None FINDINGS: Patchy subsegmental atelectasis or scarring at the left lung base. Relatively low lung volumes with crowding of perihilar bronchovascular markings. Heart size and mediastinal contours are within normal limits. Aortic Atherosclerosis (ICD10-170.0). No effusion. Visualized bones unremarkable. IMPRESSION: Low lung volumes with left base subsegmental atelectasis or scarring. Electronically Signed   By: Corlis Leak M.D.   On: 09/12/2022 12:01    Procedures Procedures    Medications Ordered in ED Medications  ketorolac (TORADOL) 15 MG/ML injection 15 mg (15 mg Intravenous Given 09/12/22 1225)  prochlorperazine (COMPAZINE) injection 10 mg (10 mg Intravenous Given 09/12/22 1228)  diphenhydrAMINE (BENADRYL) injection 25 mg (25 mg Intravenous Given 09/12/22 1226)  sodium chloride 0.9 % bolus 1,000 mL (1,000 mLs Intravenous New Bag/Given 09/12/22 1230)    ED Course/ Medical Decision Making/ A&P                                  Medical Decision Making Amount and/or Complexity of Data Reviewed Labs: ordered. Decision-making details documented in ED Course. Radiology: ordered. Decision-making details documented in ED Course. ECG/medicine tests: ordered. Decision-making details documented in ED Course.  Risk Prescription drug management.   Medical Decision Making:   Natalie Gibson is a 62 y.o. female who presented to the ED today with multiple complaints detailed above.    Patient's presentation is complicated by their history of recent eye surgery,  HTN, DM.  Complete initial physical exam performed, notably the patient was neurologically intact.  Nontoxic-appearing.  She was very anxious.  Abdomen soft and nontender.    Reviewed and confirmed nursing documentation for past medical history, family history, social history.    Initial Assessment:   With the patient's presentation and multiple complaints it is difficult to narrow differential diagnoses but it includes but is not limited to ACS, arrhythmia, pneumonia, anxiety, atypical chest pain, surgical complication, UTI, medication overdose, chronic pain. This is most consistent with an acute complicated illness  Initial Plan:  Screening labs including CBC and Metabolic panel to evaluate for infectious or metabolic etiology of disease.  Urinalysis with reflex culture ordered to evaluate for UTI or relevant urologic/nephrologic pathology.  CXR to evaluate for structural/infectious intrathoracic pathology.  EKG and troponin to evaluate for cardiac pathology Acetaminophen level to assess for toxic overdose COVID swab to assess for viral etiology Symptomatic management Objective evaluation as below reviewed   Initial Study Results:   Laboratory  All laboratory results reviewed without evidence of clinically relevant pathology.   Exceptions include: UA appears contaminated  EKG EKG was reviewed independently.  Sinus rhythm.  PACs.  No acute ST ST changes.  No  STEMI.  Radiology:  All images reviewed independently. Agree with radiology report at this time.   DG Chest 2 View  Result Date: 09/12/2022 CLINICAL DATA:  sob EXAM: CHEST - 2 VIEW COMPARISON:  None FINDINGS: Patchy subsegmental atelectasis or scarring at the left lung base. Relatively low lung volumes with crowding of perihilar bronchovascular markings. Heart size and mediastinal contours are within normal limits. Aortic Atherosclerosis (ICD10-170.0). No effusion. Visualized bones unremarkable. IMPRESSION: Low lung volumes with left base subsegmental atelectasis or scarring. Electronically Signed   By: Corlis Leak M.D.   On: 09/12/2022 12:01      Final Assessment and Plan:   62 year old female presents to the ED with multiple complaints as above.  Primarily chest pain.  No acute changes to suggest ACS on EKG.  Troponin normal.  Chest x-ray clear.  Patient does appear very anxious on initial exam.  Notes symptoms have been happening intermittently.  Of note, she recently did have a surgery but tells me that she has no acute complaints related to that today and has scheduled follow-up with her ophthalmologist on Wednesday.  Also complains of a headache.  Has a history of migraines.  Neurologically intact.  Given headache cocktail with relief of headache.  Also complains of urinary frequency.  UA appears contaminated but does not appear consistent with a UTI.  Abdomen soft and nontender.  Low suspicion for acute abdomen.  Complains of taking large amounts of Tylenol to treat severe headache at home.  Concern for overdose.  This happened yesterday and acetaminophen levels are undetectable today.  Again patient neurologically intact.  Vital signs reassuring.  Suspect that patient's multiple complaints are likely confounded by anxiety.  On multiple rechecks, she is doing well and reports that symptoms are significantly improved.  Overwhelmingly reassuring labs.  Patient reports that she wants to go home which I  do believe is reasonable.  Do not suspect emergent process today.  Patient given strict ED return precautions for any worsening condition and agreeable to return if needed.  She will follow-up with her ophthalmologist as scheduled or sooner for worsening visual concerns.  All questions answered and stable for discharge.   Clinical Impression:  1. Acute nonintractable headache, unspecified headache type   2. Atypical chest  pain   3. Anxiety   4. Urinary frequency      Discharge           Final Clinical Impression(s) / ED Diagnoses Final diagnoses:  Acute nonintractable headache, unspecified headache type  Atypical chest pain  Anxiety  Urinary frequency    Rx / DC Orders ED Discharge Orders     None         Tonette Lederer, PA-C 09/12/22 1458    Derwood Kaplan, MD 09/12/22 2328

## 2022-09-23 ENCOUNTER — Ambulatory Visit: Payer: Medicaid Other | Admitting: Family Medicine

## 2022-09-23 ENCOUNTER — Ambulatory Visit: Payer: Medicaid Other | Admitting: *Deleted

## 2022-09-23 ENCOUNTER — Other Ambulatory Visit: Payer: Self-pay | Admitting: Internal Medicine

## 2022-09-23 ENCOUNTER — Encounter: Payer: Self-pay | Admitting: *Deleted

## 2022-09-23 VITALS — BP 110/70 | HR 89 | Temp 98.0°F | Resp 20 | Ht 67.0 in | Wt 265.0 lb

## 2022-09-23 DIAGNOSIS — F419 Anxiety disorder, unspecified: Secondary | ICD-10-CM

## 2022-09-23 MED ORDER — ALPRAZOLAM 0.25 MG PO TABS: 0.2500 mg | ORAL_TABLET | Freq: Two times a day (BID) | ORAL | 0 refills | Status: DC | PRN

## 2022-09-23 NOTE — Progress Notes (Signed)
Pt had eye surgery on 8/27 at [facility]. Vision is very very limited & causing her stress & anxiety. Pt is afraid that her vision will not return because its been over 2 weeks. She can not sleep at night because of this concern.   During our discussion, patient denied chest pain or shortness of breath. Her nephew was present  as she can not be alone. She used his shoulders for guidance & they took a Uber to the office for her appt today with Dr. Clent Ridges. Once she arrived she was notified that Dr. Clent Ridges was not in office today.  I obtained her vitals, reviewed her allergy & med list & discussed with Dr. Darrick Huntsman.  Dr. Darrick Huntsman agreed to send in a low dose of Xanax to [facility]. Pt was notified & took an Benedetto Goad to pick up her medication before she headed home.   Patient has an appt tomorrow with Evelene Croon, NP & I told her due to her transportation that we will switch her visit to a virtual & her nephew stated the he would be available to assist her.  Today's Vitals   09/23/22 1506  BP: 110/70  Pulse: 89  Resp: 20  Temp: 98 F (36.7 C)  TempSrc: Oral  SpO2: 97%  Weight: 265 lb (120.2 kg)  Height: 5\' 7"  (1.702 m)   Body mass index is 41.5 kg/m.     I have reviewed the above information and agree with above.   Duncan Dull, MD

## 2022-09-24 ENCOUNTER — Telehealth: Payer: Medicaid Other | Admitting: Nurse Practitioner

## 2022-09-24 ENCOUNTER — Encounter: Payer: Self-pay | Admitting: Nurse Practitioner

## 2022-09-24 ENCOUNTER — Ambulatory Visit: Payer: Medicaid Other | Admitting: Nurse Practitioner

## 2022-09-24 DIAGNOSIS — F419 Anxiety disorder, unspecified: Secondary | ICD-10-CM | POA: Diagnosis not present

## 2022-09-24 DIAGNOSIS — G4700 Insomnia, unspecified: Secondary | ICD-10-CM | POA: Diagnosis not present

## 2022-09-24 MED ORDER — HYDROXYZINE PAMOATE 25 MG PO CAPS: 25.0000 mg | ORAL_CAPSULE | Freq: Three times a day (TID) | ORAL | 0 refills | Status: DC | PRN

## 2022-09-24 NOTE — Progress Notes (Signed)
Virtual Visit via Video Note  I connected with Natalie Gibson on 09/24/22 at 3:14 PM by a video enabled telemedicine application and verified that I am speaking with the correct person using two identifiers.  Patient Location: Home Provider Location: Office/Clinic  I discussed the limitations, risks, security, and privacy concerns of performing an evaluation and management service by video and the availability of in person appointments. I also discussed with the patient that there may be a patient responsible charge related to this service. The patient expressed understanding and agreed to proceed.  Subjective: PCP: Dana Allan, MD  Chief Complaint  Patient presents with   Medical Management of Chronic Issues   HPI  Patient is seen due to anxiety, stress and insomnia.  Patient and her nephew is present during the visit.  She states that she had bilateral cataract surgery at [facility].  Her vision is very limited after the eye surgery.  She had postop follow-up at [facility] today.   Due to the limited vision ability to perform daily activities are significantly impaired.  Patient reports that her anxiety is better under control after taking Xanax but she needs something to sleep.  States she is not able to sleep even after taking Xanax.  Denies any chest pain, shortness of breath or fever  ROS: Per HPI  Current Outpatient Medications:    ALPRAZolam (XANAX) 0.25 MG tablet, Take 1 tablet (0.25 mg total) by mouth 2 (two) times daily as needed for anxiety., Disp: 20 tablet, Rfl: 0   dorzolamide (TRUSOPT) 2 % ophthalmic solution, Place 1 drop into both eyes 2 (two) times daily., Disp: , Rfl:    hydrOXYzine (VISTARIL) 25 MG capsule, Take 1 capsule (25 mg total) by mouth every 8 (eight) hours as needed., Disp: 30 capsule, Rfl: 0   latanoprost (XALATAN) 0.005 % ophthalmic solution, Place 1 drop into both eyes at bedtime., Disp: , Rfl:    metFORMIN (GLUCOPHAGE-XR) 500 MG 24 hr tablet, Take 1 tablet (500  mg total) by mouth daily with breakfast., Disp: 90 tablet, Rfl: 3   prednisoLONE acetate (PRED FORTE) 1 % ophthalmic suspension, Place 1 drop into the left eye 2 (two) times daily., Disp: , Rfl:    rosuvastatin (CRESTOR) 20 MG tablet, Take 1 tablet (20 mg total) by mouth daily., Disp: 90 tablet, Rfl: 1   Vitamin D, Ergocalciferol, (DRISDOL) 1.25 MG (50000 UNIT) CAPS capsule, Take 1 capsule (50,000 Units total) by mouth every 7 (seven) days., Disp: 12 capsule, Rfl: 1   VYZULTA 0.024 % SOLN, Apply 1 drop to eye at bedtime., Disp: , Rfl:   Observations/Objective: There were no vitals filed for this visit. Physical Exam Constitutional:      General: She is not in acute distress.    Appearance: Normal appearance. She is not ill-appearing.  Eyes:     Conjunctiva/sclera: Conjunctivae normal.  Pulmonary:     Effort: No respiratory distress.  Neurological:     General: No focal deficit present.     Mental Status: She is alert and oriented to person, place, and time.  Psychiatric:        Mood and Affect: Mood normal.        Behavior: Behavior normal.     Assessment and Plan: Insomnia, unspecified type Assessment & Plan: Will treat with hydroxyzine at bedtime. Advised patient to avoid caffeine in the evening. Sleep hygiene discussed.   Anxiety Assessment & Plan: Stable on Xanax. Continue Xanax as needed.  Advised patient to perform deep  breathing exercise and meditation.   Other orders -     hydrOXYzine Pamoate; Take 1 capsule (25 mg total) by mouth every 8 (eight) hours as needed.  Dispense: 30 capsule; Refill: 0    Follow Up Instructions: No follow-ups on file.   I discussed the assessment and treatment plan with the patient. The patient was provided an opportunity to ask questions, and all were answered. The patient agreed with the plan and demonstrated an understanding of the instructions.   The patient was advised to call back or seek an in-person evaluation if the symptoms  worsen or if the condition fails to improve as anticipated.  The above assessment and management plan was discussed with the patient. The patient verbalized understanding of and has agreed to the management plan.   Kara Dies, NP

## 2022-09-27 DIAGNOSIS — F419 Anxiety disorder, unspecified: Secondary | ICD-10-CM | POA: Insufficient documentation

## 2022-09-27 DIAGNOSIS — F39 Unspecified mood [affective] disorder: Secondary | ICD-10-CM | POA: Insufficient documentation

## 2022-09-27 DIAGNOSIS — G4700 Insomnia, unspecified: Secondary | ICD-10-CM | POA: Insufficient documentation

## 2022-09-27 NOTE — Assessment & Plan Note (Addendum)
Stable on Xanax. Continue Xanax as needed.  Advised patient to perform deep breathing exercise and meditation.

## 2022-09-27 NOTE — Assessment & Plan Note (Signed)
Will treat with hydroxyzine at bedtime. Advised patient to avoid caffeine in the evening. Sleep hygiene discussed.

## 2022-09-28 ENCOUNTER — Telehealth: Payer: Self-pay | Admitting: Family Medicine

## 2022-09-28 NOTE — Telephone Encounter (Signed)
Patient just called and said she was here one day last week and she is almost out of her medication. The name is ALPRAZolam (XANAX) 0.25 MG tablet. [facility] - [place], [place] - [address] [place], [place] [address] Phone: [phone]  Fax: [phone]  She only has 2 pills left.  Her number is [phone]

## 2022-09-28 NOTE — Telephone Encounter (Signed)
Pt is having eye surgery on Oct 16, 2022 [place] filled on 09/23/2022 for 20 and pt stated she is taking 2 a day, and it the only thing that is keeping her calm.

## 2022-09-29 NOTE — Telephone Encounter (Signed)
Pt said this is the only thing that is working for her anxiety. She was sent in 20 but she stated that sitting at home until her eye surgery is making her feel on edge and the Xanax is the only thing that helps her relax. She said she is having surgery on 10/4 and needs enough to get her to that date.

## 2022-10-01 ENCOUNTER — Encounter: Payer: Self-pay | Admitting: Nurse Practitioner

## 2022-10-01 ENCOUNTER — Ambulatory Visit: Payer: Medicaid Other | Admitting: Nurse Practitioner

## 2022-10-01 VITALS — BP 128/84 | HR 87 | Temp 98.3°F | Ht 67.0 in | Wt 257.0 lb

## 2022-10-01 DIAGNOSIS — F419 Anxiety disorder, unspecified: Secondary | ICD-10-CM

## 2022-10-01 MED ORDER — ALPRAZOLAM 0.25 MG PO TABS: 0.2500 mg | ORAL_TABLET | Freq: Two times a day (BID) | ORAL | 0 refills | Status: DC | PRN

## 2022-10-01 NOTE — Patient Instructions (Signed)
Refilled xanax PRN twice a day for 1 month.

## 2022-10-05 NOTE — Telephone Encounter (Signed)
Pt had a virtual visit with Natalie Gibson and was refilled.

## 2022-10-11 NOTE — Assessment & Plan Note (Signed)
PHQ-9 score 18 and GAD score 21. Refilled  Xanax twice a day as needed. PDMP reviewed and nonopioid controlled substance signed by the patient.

## 2022-10-14 ENCOUNTER — Other Ambulatory Visit: Payer: Self-pay | Admitting: Family Medicine

## 2022-10-14 DIAGNOSIS — E559 Vitamin D deficiency, unspecified: Secondary | ICD-10-CM

## 2022-10-15 NOTE — Telephone Encounter (Signed)
Refilled: 07/27/2022 Last OV: 09/24/2022 Next OV: not scheduled  Last Vitamin D level: 07/22/2022 was 19.40

## 2022-10-24 ENCOUNTER — Other Ambulatory Visit: Payer: Self-pay | Admitting: Family Medicine

## 2022-10-24 DIAGNOSIS — E1169 Type 2 diabetes mellitus with other specified complication: Secondary | ICD-10-CM

## 2022-10-29 ENCOUNTER — Encounter: Payer: Self-pay | Admitting: Family Medicine

## 2022-10-29 ENCOUNTER — Ambulatory Visit: Payer: Medicaid Other | Admitting: Family Medicine

## 2022-10-29 VITALS — BP 126/82 | HR 85 | Temp 97.9°F | Resp 16 | Ht 67.0 in | Wt 257.0 lb

## 2022-10-29 DIAGNOSIS — F39 Unspecified mood [affective] disorder: Secondary | ICD-10-CM | POA: Diagnosis not present

## 2022-10-29 DIAGNOSIS — Z7984 Long term (current) use of oral hypoglycemic drugs: Secondary | ICD-10-CM | POA: Diagnosis not present

## 2022-10-29 DIAGNOSIS — H409 Unspecified glaucoma: Secondary | ICD-10-CM | POA: Diagnosis not present

## 2022-10-29 DIAGNOSIS — E118 Type 2 diabetes mellitus with unspecified complications: Secondary | ICD-10-CM | POA: Diagnosis not present

## 2022-10-29 LAB — POCT GLYCOSYLATED HEMOGLOBIN (HGB A1C): Hemoglobin A1C: 6.9 % — AB (ref 4.0–5.6)

## 2022-10-29 MED ORDER — METFORMIN HCL ER 500 MG PO TB24
500.0000 mg | ORAL_TABLET | Freq: Two times a day (BID) | ORAL | 3 refills | Status: DC
Start: 2022-10-29 — End: 2023-09-23

## 2022-10-29 MED ORDER — ESCITALOPRAM OXALATE 5 MG PO TABS
5.0000 mg | ORAL_TABLET | Freq: Every day | ORAL | 0 refills | Status: DC
Start: 2022-10-29 — End: 2022-11-26

## 2022-10-29 MED ORDER — ALPRAZOLAM 0.25 MG PO TABS: 0.2500 mg | ORAL_TABLET | Freq: Two times a day (BID) | ORAL | 0 refills | Status: DC | PRN

## 2022-10-29 NOTE — Patient Instructions (Signed)
It was a pleasure meeting you today. Thank you for allowing me to take part in your health care.  Our goals for today as we discussed include:  A1c 6.9 Increase Metformin XL 500 mg two times a day  Start Lexapro 5 mg daily Continue   If you have any questions or concerns, please do not hesitate to call the office at [phone].  I look forward to our next visit and until then take care and stay safe.  Regards,   Dana Allan, MD   [facility]
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
## 2022-11-01 ENCOUNTER — Encounter: Payer: Self-pay | Admitting: Family Medicine

## 2022-11-01 NOTE — Assessment & Plan Note (Signed)
Currently on Metformin 500mg  daily. Last A1C was 7.1. Patient tolerating Metformin well. -Check current A1C. -Increase Metformin XR 500 mg BID

## 2022-11-01 NOTE — Assessment & Plan Note (Signed)
High level of anxiety due to vision loss and other stressors. Currently on Xanax as needed, which provides some relief. Discussed the risks of long-term Xanax use and the potential for addiction. -Continue Xanax as needed for severe panic attacks. -Consider starting a daily medication such as Lexapro to manage anxiety levels. -UDS and contract signed between patient and NP Evelene Croon. -Will refill medication until follow up with Ophthalmology.  Plan to wean once vision improves.

## 2022-11-01 NOTE — Assessment & Plan Note (Signed)
Significant distress due to vision loss and fear of complete blindness. Next eye doctor appointment scheduled for November 1st. -No changes to current treatment plan.

## 2022-11-02 DIAGNOSIS — H16143 Punctate keratitis, bilateral: Secondary | ICD-10-CM | POA: Diagnosis not present

## 2022-11-02 DIAGNOSIS — H401112 Primary open-angle glaucoma, right eye, moderate stage: Secondary | ICD-10-CM | POA: Diagnosis not present

## 2022-11-26 ENCOUNTER — Other Ambulatory Visit: Payer: Self-pay | Admitting: Family Medicine

## 2022-11-26 DIAGNOSIS — F39 Unspecified mood [affective] disorder: Secondary | ICD-10-CM

## 2022-12-01 ENCOUNTER — Other Ambulatory Visit (HOSPITAL_COMMUNITY)
Admission: RE | Admit: 2022-12-01 | Discharge: 2022-12-01 | Disposition: A | Discharge status: Home / Self Care | Payer: Medicaid Other | Source: Ambulatory Visit | Attending: Family Medicine | Admitting: Family Medicine

## 2022-12-01 ENCOUNTER — Ambulatory Visit: Payer: Medicaid Other | Admitting: Family Medicine

## 2022-12-01 ENCOUNTER — Encounter: Payer: Self-pay | Admitting: Family Medicine

## 2022-12-01 ENCOUNTER — Telehealth: Payer: Self-pay

## 2022-12-01 VITALS — BP 120/64 | HR 67 | Temp 97.9°F | Resp 16 | Ht 67.0 in | Wt 257.5 lb

## 2022-12-01 DIAGNOSIS — H409 Unspecified glaucoma: Secondary | ICD-10-CM | POA: Diagnosis not present

## 2022-12-01 DIAGNOSIS — Z0000 Encounter for general adult medical examination without abnormal findings: Secondary | ICD-10-CM | POA: Diagnosis not present

## 2022-12-01 DIAGNOSIS — Z23 Encounter for immunization: Secondary | ICD-10-CM | POA: Diagnosis not present

## 2022-12-01 DIAGNOSIS — Z124 Encounter for screening for malignant neoplasm of cervix: Secondary | ICD-10-CM

## 2022-12-01 DIAGNOSIS — Z7984 Long term (current) use of oral hypoglycemic drugs: Secondary | ICD-10-CM

## 2022-12-01 DIAGNOSIS — F39 Unspecified mood [affective] disorder: Secondary | ICD-10-CM

## 2022-12-01 DIAGNOSIS — E118 Type 2 diabetes mellitus with unspecified complications: Secondary | ICD-10-CM

## 2022-12-01 LAB — MICROALBUMIN / CREATININE URINE RATIO
Creatinine,U: 242.3 mg/dL
Microalb Creat Ratio: 0.9 mg/g (ref 0.0–30.0)
Microalb, Ur: 2.3 mg/dL — ABNORMAL HIGH (ref 0.0–1.9)

## 2022-12-01 MED ORDER — ESCITALOPRAM OXALATE 10 MG PO TABS: 10.0000 mg | ORAL_TABLET | Freq: Every day | ORAL | 1 refills | Status: DC

## 2022-12-01 MED ORDER — ALPRAZOLAM 0.25 MG PO TABS: 0.2500 mg | ORAL_TABLET | Freq: Two times a day (BID) | ORAL | 0 refills | Status: DC | PRN
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
## 2022-12-01 NOTE — Telephone Encounter (Signed)
Called pt and she was able to pick the medication up.

## 2022-12-01 NOTE — Patient Instructions (Addendum)
It was a pleasure meeting you today. Thank you for allowing me to take part in your health care.  Our goals for today as we discussed include:  Increase Lexapro to 10 mg daily Limit use of Xanax to as needed only  Follow up in 4 weeks  Flu vaccine today  Referral sent for Mammogram. Please call to schedule appointment. [facility] [address] [place], [place] [address] [phone]     This is a list of the screening recommended for you and due dates:  Health Maintenance  Topic Date Due   Eye exam for diabetics  Never done   Yearly kidney health urinalysis for diabetes  Never done   DTaP/Tdap/Td vaccine (1 - Tdap) Never done   Pap with HPV screening  Never done   Mammogram  Never done   Zoster (Shingles) Vaccine (1 of 2) Never done   COVID-19 Vaccine (3 - 2023-24 season) 09/13/2022   Flu Shot  04/12/2023*   Hemoglobin A1C  04/29/2023   Yearly kidney function blood test for diabetes  09/12/2023   Complete foot exam   10/29/2023   Colon Cancer Screening  08/25/2032   Hepatitis C Screening  Completed   HIV Screening  Completed   HPV Vaccine  Aged Out  *Topic was postponed. The date shown is not the original due date.    If you have any questions or concerns, please do not hesitate to call the office at [phone].  I look forward to our next visit and until then take care and stay safe.  Regards,   Dana Allan, MD   [facility]

## 2022-12-01 NOTE — Telephone Encounter (Signed)
Patient states she is at [facility] to pick up her prescriptions.  Patient states she just left our office and Dr. Dana Allan told her that she was going to call in two prescriptions.  Patient states one of the prescriptions is ready, but the one for ALPRAZolam (XANAX) 0.25 MG tablet is not.  Patient states she needs this medication for her anxiety.  Patient states she has to catch rides right now, so she would like to go ahead and get this medication while she is here.  I was unable to reach [facility], CMA, at the time of the call.  I spoke with Thurmond Butts, CMA, who states the prescription was called in.  Natalie Gibson states she will call the pharmacy.  I relayed message to patient.

## 2022-12-04 LAB — CYTOLOGY - PAP
Comment: NEGATIVE
Diagnosis: NEGATIVE
Diagnosis: REACTIVE
High risk HPV: NEGATIVE

## 2022-12-10 ENCOUNTER — Encounter: Payer: Self-pay | Admitting: Family Medicine

## 2022-12-10 DIAGNOSIS — Z124 Encounter for screening for malignant neoplasm of cervix: Secondary | ICD-10-CM | POA: Insufficient documentation

## 2022-12-10 DIAGNOSIS — Z23 Encounter for immunization: Secondary | ICD-10-CM | POA: Insufficient documentation

## 2022-12-10 DIAGNOSIS — Z0189 Encounter for other specified special examinations: Secondary | ICD-10-CM | POA: Insufficient documentation

## 2022-12-10 NOTE — Assessment & Plan Note (Signed)
Ongoing visual disturbances described as "smoky" vision, particularly worse at night. Patient has a scheduled procedure on December 16th to address this issue. Anxiety has increased due to decreased vision -No changes to current management plan as this is being addressed by a specialist.

## 2022-12-10 NOTE — Assessment & Plan Note (Signed)
Patient reports benefit from Lexapro but has been off it for approximately 2 weeks due to running out of medication. Anxiety symptoms return in the late afternoon. -Increase Lexapro to 10mg  daily. -Discussed long-term plan to reduce reliance on Xanax by managing anxiety with Lexapro. -Has recent UDS and contract on file, was signed with different provider.   -Offered mental health resources and encouraged CBT

## 2022-12-10 NOTE — Assessment & Plan Note (Signed)
Patient presented for routine Pap smear. No reported vaginal discharge or bleeding. -Performed Pap smear with HPV cotesting -No indication for STI screening -Results will be communicated to the patient in 1-2 days. -If results are normal, next Pap smear will be in 5 years. Likely will have aged out of screening at that time. Patient aware no recommendations for continued cervical cancer screening at 67y if no previous abnormalities, which she indicated there has not been.

## 2022-12-10 NOTE — Assessment & Plan Note (Addendum)
Currently on Metformin 500mg  daily. Last A1C was 7.1. Patient tolerating Metformin well. -Check UACR today -Continue Metformin XR 500 mg BID -Continue statin -BP well controlled

## 2022-12-25 DIAGNOSIS — H401123 Primary open-angle glaucoma, left eye, severe stage: Secondary | ICD-10-CM | POA: Diagnosis not present

## 2022-12-25 DIAGNOSIS — H401112 Primary open-angle glaucoma, right eye, moderate stage: Secondary | ICD-10-CM | POA: Diagnosis not present

## 2022-12-29 ENCOUNTER — Ambulatory Visit (INDEPENDENT_AMBULATORY_CARE_PROVIDER_SITE_OTHER): Payer: Medicaid Other | Admitting: Family Medicine

## 2022-12-29 ENCOUNTER — Encounter: Payer: Self-pay | Admitting: Family Medicine

## 2022-12-29 VITALS — BP 138/70 | HR 84 | Temp 97.9°F | Resp 18 | Ht 67.0 in | Wt 257.4 lb

## 2022-12-29 DIAGNOSIS — Z1231 Encounter for screening mammogram for malignant neoplasm of breast: Secondary | ICD-10-CM

## 2022-12-29 DIAGNOSIS — H409 Unspecified glaucoma: Secondary | ICD-10-CM | POA: Diagnosis not present

## 2022-12-29 DIAGNOSIS — F39 Unspecified mood [affective] disorder: Secondary | ICD-10-CM | POA: Diagnosis not present

## 2022-12-29 DIAGNOSIS — Z7984 Long term (current) use of oral hypoglycemic drugs: Secondary | ICD-10-CM | POA: Diagnosis not present

## 2022-12-29 DIAGNOSIS — R030 Elevated blood-pressure reading, without diagnosis of hypertension: Secondary | ICD-10-CM

## 2022-12-29 DIAGNOSIS — E118 Type 2 diabetes mellitus with unspecified complications: Secondary | ICD-10-CM

## 2022-12-29 MED ORDER — ESCITALOPRAM OXALATE 10 MG PO TABS: 20.0000 mg | ORAL_TABLET | Freq: Every day | ORAL | 3 refills | Status: DC

## 2022-12-29 MED ORDER — ALPRAZOLAM 0.25 MG PO TABS: 0.2500 mg | ORAL_TABLET | Freq: Two times a day (BID) | ORAL | 0 refills | Status: DC | PRN

## 2022-12-29 MED ORDER — HYDROXYZINE PAMOATE 25 MG PO CAPS: 25.0000 mg | ORAL_CAPSULE | Freq: Three times a day (TID) | ORAL | 0 refills | Status: DC | PRN

## 2022-12-29 NOTE — Progress Notes (Signed)
SUBJECTIVE:   Chief Complaint  Patient presents with   mood disorder   HPI Presents for follow up chronic disease management  Discussed the use of AI scribe software for clinical note transcription with the patient, who gave verbal consent to proceed.  History of Present Illness The patient, with a history of glaucoma, presents with ongoing visual disturbances described as a 'smoky' or 'foggy' vision, which has significantly impacted her daily activities and ability to work. She reports difficulty seeing during the day and an inability to drive at night. The patient has been unable to return to her job at a [employer] due to these visual disturbances.  The patient also reports feeling 'not in the best spirit' and has been taking Lexapro daily, which has helped her feel 'really relaxed.' However, she continues to struggle with sleep disturbances, taking Xanax twice daily to manage this.  In addition to her own health concerns, the patient has been caring for her mother, who has recently been unwell with a UTI and suspected kidney issues, adding to her stress.  The patient's blood pressure has been noted to be elevated, and she admits to smoking 'too many cigarettes.' She is also on Metformin and a weekly Vitamin D supplement. The patient's visual disturbances and her impact on daily life appear to be the primary concern at this time.    PERTINENT PMH / PSH: As above  OBJECTIVE:  BP 138/70   Pulse 84   Temp 97.9 F (36.6 C)   Resp 18   Ht 5\' 7"  (1.702 m)   Wt 257 lb 6 oz (116.7 kg)   SpO2 97%   BMI 40.31 kg/m    Physical Exam Vitals reviewed.  Constitutional:      General: She is not in acute distress.    Appearance: Normal appearance. She is obese. She is not ill-appearing, toxic-appearing or diaphoretic.  Eyes:     General:        Right eye: No discharge.        Left eye: No discharge.     Conjunctiva/sclera: Conjunctivae normal.  Cardiovascular:      Rate and Rhythm: Normal rate.  Pulmonary:     Effort: Pulmonary effort is normal.  Abdominal:     General: Bowel sounds are normal.  Musculoskeletal:        General: Normal range of motion.  Skin:    General: Skin is warm and dry.  Neurological:     General: No focal deficit present.     Mental Status: She is alert and oriented to person, place, and time. Mental status is at baseline.  Psychiatric:        Mood and Affect: Mood is anxious and depressed.        Behavior: Behavior normal.        Thought Content: Thought content normal.        Judgment: Judgment normal.        12/29/2022    8:19 AM 12/01/2022    9:54 AM 10/29/2022    2:34 PM 10/01/2022    1:49 PM 10/01/2022   11:30 AM  Depression screen PHQ 2/9  Decreased Interest 0 3 3 3  0  Down, Depressed, Hopeless 2 1 2 3  0  PHQ - 2 Score 2 4 5 6  0  Altered sleeping 3 1 3 2  0  Tired, decreased energy 2 0 3 3 0  Change in appetite 3 3 3 1  0  Feeling bad or failure about  yourself  3 0 0 3 0  Trouble concentrating 0 0 3 3 0  Moving slowly or fidgety/restless 0 0 0 0 0  Suicidal thoughts 0 0 0 0 0  PHQ-9 Score 13 8 17 18  0  Difficult doing work/chores Extremely dIfficult Somewhat difficult Extremely dIfficult Extremely dIfficult Not difficult at all      12/29/2022    8:19 AM 12/01/2022    9:54 AM 10/29/2022    2:34 PM 10/01/2022    1:49 PM  GAD 7 : Generalized Anxiety Score  Nervous, Anxious, on Edge 0 0 3 3  Control/stop worrying 3 3 3 3   Worry too much - different things 3 3 3 3   Trouble relaxing 3 3 3 3   Restless 0 3 3 3   Easily annoyed or irritable 3 3 3 3   Afraid - awful might happen 3 3 3 3   Total GAD 7 Score 15 18 21 21   Anxiety Difficulty Extremely difficult Extremely difficult Very difficult Extremely difficult    ASSESSMENT/PLAN:  Mood disorder (HCC) Assessment & Plan: Patient reports feeling "not in the best spirit" and has been dealing with significant stressors, including her eye condition, mother's  illness, and inability to work. She is currently on Lexapro and Xanax. -Increase Lexapro to 20mg  daily. -Start Hydroxyzine 25mg  up to three times a day as needed for anxiety. -Trial reduction of Xanax use, using Hydroxyzine as first-line for anxiety symptoms.  Orders: -     hydrOXYzine Pamoate; Take 1 capsule (25 mg total) by mouth every 8 (eight) hours as needed.  Dispense: 30 capsule; Refill: 0 -     Escitalopram Oxalate; Take 2 tablets (20 mg total) by mouth daily.  Dispense: 60 tablet; Refill: 3 -     ALPRAZolam; Take 1 tablet (0.25 mg total) by mouth 2 (two) times daily as needed for anxiety.  Dispense: 60 tablet; Refill: 0  Breast cancer screening by mammogram -     3D Screening Mammogram, Left and Right; Future  Glaucoma of both eyes, unspecified glaucoma type Assessment & Plan: Patient reports persistent visual impairment, described as a "foggy" vision, despite undergoing surgeries. This has significantly impacted her daily activities, including her ability to work and drive. -Continue current ophthalmologic care with Dr. Albin Felling.   Elevated blood pressure reading Assessment & Plan: Elevated blood pressure noted during the visit. Repeat reading decreased.   -Monitor blood pressure. Consider initiation of antihypertensive medication if persistently elevated given history of DM 2   Type 2 diabetes with complication [facility]) Assessment & Plan: -Continue Metformin for diabetes management.       PDMP reviewed  Return in about 4 months (around 04/29/2023) for PCP, Mood disorder, DM.  Dana Allan, MD

## 2022-12-29 NOTE — Patient Instructions (Addendum)
It was a pleasure meeting you today. Thank you for allowing me to take part in your health care.  Our goals for today as we discussed include:  Increase Lexapro to 20 mg daily.  Take 2 tablets a day Start Hydroxyzine 25 mg three times a day as needed for anxiety.  Use this before the Xanax Continue Xanax, use sparingly  Side effects of Xanax Benzodiazepine use is associated with an increased risk of falls, hip fractures, and mobility problems in older adults. In addition it may be associated with an increased risk of disability in mobility and activities of daily living in this population.  Other side effects include: Decreased libido, weight gain, weight loss,increased/decreased appetite,constipation,cognitive dysfunction,memory impairment, irritability  Sited from [employer] and Up To Date    All pharmacy for refills of other medications.  Referral sent for Mammogram. Please call to schedule appointment. [facility] [address] [place], [place] [address] [phone]    This is a list of the screening recommended for you and due dates:  Health Maintenance  Topic Date Due   Eye exam for diabetics  Never done   DTaP/Tdap/Td vaccine (1 - Tdap) Never done   Mammogram  Never done   Zoster (Shingles) Vaccine (1 of 2) Never done   COVID-19 Vaccine (3 - 2024-25 season) 09/13/2022   Hemoglobin A1C  04/29/2023   Yearly kidney function blood test for diabetes  09/12/2023   Complete foot exam   10/29/2023   Yearly kidney health urinalysis for diabetes  12/01/2023   Pap with HPV screening  12/01/2027   Colon Cancer Screening  08/25/2032   Flu Shot  Completed   Hepatitis C Screening  Completed   HIV Screening  Completed   HPV Vaccine  Aged Out     Follow up in 4 months  If you have any questions or concerns, please do not hesitate to call the office at [phone].  I look forward to our next visit and until then take care and stay safe.  Regards,   Dana Allan,  MD   [facility]

## 2022-12-30 ENCOUNTER — Other Ambulatory Visit: Payer: Self-pay | Admitting: Family Medicine

## 2023-01-04 ENCOUNTER — Telehealth: Payer: Self-pay

## 2023-01-04 NOTE — Telephone Encounter (Signed)
Called pharmacy and they said they have the refill request and will fill it today.

## 2023-01-04 NOTE — Telephone Encounter (Signed)
Copied from CRM [id number]. Topic: Clinical - Medication Refill >> Jan 04, 2023  1:03 PM Clayton Bibles wrote: Most Recent Primary Care Visit:  Provider: Dana Allan  Department: [facility]  Visit Type: OFFICE VISIT  Date: 12/29/2022  Medication: ALPRAZolam Prudy Feeler) 0.25 MG  Has the patient contacted their pharmacy? No (Agent: If no, request that the patient contact the pharmacy for the refill. If patient does not wish to contact the pharmacy document the reason why and proceed with request.) (Agent: If yes, when and what did the pharmacy advise?)  Is this the correct pharmacy for this prescription?  Yes If no, delete pharmacy and type the correct one.  This is the patient's preferred pharmacy:  [facility] - [place], [place] - [address] [place] [address] Phone: [phone] Fax: [phone]   Has the prescription been filled recently? No  Is the patient out of the medication? She has 2 pills left  Has the patient been seen for an appointment in the last year OR does the patient have an upcoming appointment? Yes  Can we respond through MyChart? No  Agent: Please be advised that Rx refills may take up to 3 business days. We ask that you follow-up with your pharmacy.

## 2023-01-05 ENCOUNTER — Encounter: Payer: Self-pay | Admitting: Family Medicine

## 2023-01-05 DIAGNOSIS — R030 Elevated blood-pressure reading, without diagnosis of hypertension: Secondary | ICD-10-CM | POA: Insufficient documentation

## 2023-01-05 NOTE — Assessment & Plan Note (Signed)
Elevated blood pressure noted during the visit. Repeat reading decreased.   -Monitor blood pressure. Consider initiation of antihypertensive medication if persistently elevated given history of DM 2

## 2023-01-05 NOTE — Assessment & Plan Note (Signed)
-  Continue Metformin for diabetes management.

## 2023-01-05 NOTE — Assessment & Plan Note (Signed)
Patient reports persistent visual impairment, described as a "foggy" vision, despite undergoing surgeries. This has significantly impacted her daily activities, including her ability to work and drive. -Continue current ophthalmologic care with Dr. Albin Felling.

## 2023-01-05 NOTE — Assessment & Plan Note (Signed)
Patient reports feeling "not in the best spirit" and has been dealing with significant stressors, including her eye condition, mother's illness, and inability to work. She is currently on Lexapro and Xanax. -Increase Lexapro to 20mg  daily. -Start Hydroxyzine 25mg  up to three times a day as needed for anxiety. -Trial reduction of Xanax use, using Hydroxyzine as first-line for anxiety symptoms.

## 2023-01-25 ENCOUNTER — Other Ambulatory Visit: Payer: Self-pay | Admitting: Family Medicine

## 2023-01-25 DIAGNOSIS — F39 Unspecified mood [affective] disorder: Secondary | ICD-10-CM

## 2023-01-25 NOTE — Telephone Encounter (Signed)
 Copied from CRM [id number]. Topic: Clinical - Medical Advice >> Jan 25, 2023  2:38 PM Thersia BROCKS wrote: Reason for CRM: Patient called in asking if she would need to come in to see the doctor in order to get a refill of medication ALPRAZolam  (XANAX ) 0.25 MG tablet , patient is requesting a callback

## 2023-01-29 ENCOUNTER — Ambulatory Visit: Payer: Medicaid Other | Admitting: Nurse Practitioner

## 2023-01-29 ENCOUNTER — Other Ambulatory Visit: Payer: Self-pay

## 2023-01-29 DIAGNOSIS — F39 Unspecified mood [affective] disorder: Secondary | ICD-10-CM

## 2023-01-29 MED ORDER — ALPRAZOLAM 0.25 MG PO TABS: 0.2500 mg | ORAL_TABLET | Freq: Two times a day (BID) | ORAL | 0 refills | Status: DC | PRN

## 2023-02-09 DIAGNOSIS — H401112 Primary open-angle glaucoma, right eye, moderate stage: Secondary | ICD-10-CM | POA: Diagnosis not present

## 2023-02-19 DIAGNOSIS — H401133 Primary open-angle glaucoma, bilateral, severe stage: Secondary | ICD-10-CM | POA: Diagnosis not present

## 2023-03-03 ENCOUNTER — Other Ambulatory Visit: Payer: Self-pay | Admitting: Family Medicine

## 2023-03-03 DIAGNOSIS — F39 Unspecified mood [affective] disorder: Secondary | ICD-10-CM

## 2023-03-03 MED ORDER — ALPRAZOLAM 0.25 MG PO TABS
0.2500 mg | ORAL_TABLET | Freq: Two times a day (BID) | ORAL | 0 refills | Status: DC | PRN
Start: 2023-03-03 — End: 2023-03-31

## 2023-03-03 NOTE — Telephone Encounter (Signed)
 Copied from CRM [id number]. Topic: Clinical - Medication Refill >> Mar 03, 2023 11:12 AM Isabell A wrote: Most Recent Primary Care Visit:  Provider: Dana Allan  Department: [facility]  Visit Type: OFFICE VISIT  Date: 12/29/2022  Medication: ALPRAZolam (XANAX) 0.25 MG tablet  Has the patient contacted their pharmacy? Yes (Agent: If no, request that the patient contact the pharmacy for the refill. If patient does not wish to contact the pharmacy document the reason why and proceed with request.) (Agent: If yes, when and what did the pharmacy advise?)  Is this the correct pharmacy for this prescription? Yes If no, delete pharmacy and type the correct one.  This is the patient's preferred pharmacy:  [facility] - [place], [place] - [address] [place] [address] Phone: [phone] Fax: [phone]   Has the prescription been filled recently? Yes  Is the patient out of the medication? No  Has the patient been seen for an appointment in the last year OR does the patient have an upcoming appointment? Yes  Can we respond through MyChart? No  Agent: Please be advised that Rx refills may take up to 3 business days. We ask that you follow-up with your pharmacy.

## 2023-03-10 ENCOUNTER — Telehealth: Payer: Self-pay

## 2023-03-10 NOTE — Telephone Encounter (Signed)
 Copied from CRM [id number]. Topic: Clinical - Medication Question >> Mar 10, 2023 12:15 PM Adele Barthel wrote: Reason for CRM:   Patient is calling to ask provider if she would qualify for a weight loss medication called Wegovy. She is wanting some assistance with weight loss and is interested in this medication.  CB# [phone] (cellphone) ask that you call the cell phone due to answering machine on landline([phone]) not working.

## 2023-03-10 NOTE — Telephone Encounter (Signed)
 Left message to return call to our office.  Pt needs an appointment to discuss.

## 2023-03-17 NOTE — Telephone Encounter (Signed)
 Pt has an appointment for 03/23/23

## 2023-03-23 ENCOUNTER — Telehealth: Payer: Self-pay

## 2023-03-23 ENCOUNTER — Ambulatory Visit: Payer: Medicaid Other | Admitting: Family Medicine

## 2023-03-23 ENCOUNTER — Encounter: Payer: Self-pay | Admitting: Family Medicine

## 2023-03-23 VITALS — BP 120/66 | HR 84 | Temp 98.4°F | Resp 20 | Ht 67.0 in | Wt 262.1 lb

## 2023-03-23 DIAGNOSIS — E785 Hyperlipidemia, unspecified: Secondary | ICD-10-CM

## 2023-03-23 DIAGNOSIS — F39 Unspecified mood [affective] disorder: Secondary | ICD-10-CM | POA: Diagnosis not present

## 2023-03-23 DIAGNOSIS — Z7985 Long-term (current) use of injectable non-insulin antidiabetic drugs: Secondary | ICD-10-CM

## 2023-03-23 DIAGNOSIS — Z720 Tobacco use: Secondary | ICD-10-CM | POA: Diagnosis not present

## 2023-03-23 DIAGNOSIS — E1169 Type 2 diabetes mellitus with other specified complication: Secondary | ICD-10-CM | POA: Diagnosis not present

## 2023-03-23 DIAGNOSIS — E118 Type 2 diabetes mellitus with unspecified complications: Secondary | ICD-10-CM | POA: Diagnosis not present

## 2023-03-23 MED ORDER — SEMAGLUTIDE(0.25 OR 0.5MG/DOS) 2 MG/3ML ~~LOC~~ SOPN
0.2500 mg | PEN_INJECTOR | SUBCUTANEOUS | Status: DC
Start: 2023-03-23 — End: 2023-04-22

## 2023-03-23 MED ORDER — HYDROXYZINE PAMOATE 25 MG PO CAPS
25.0000 mg | ORAL_CAPSULE | Freq: Three times a day (TID) | ORAL | 3 refills | Status: DC | PRN
Start: 2023-03-23 — End: 2023-06-16

## 2023-03-23 MED ORDER — SEMAGLUTIDE(0.25 OR 0.5MG/DOS) 2 MG/3ML ~~LOC~~ SOPN: 0.2500 mg | PEN_INJECTOR | SUBCUTANEOUS | 0 refills | Status: DC

## 2023-03-23 NOTE — Telephone Encounter (Signed)
 Copied from CRM [id number]. Topic: Clinical - Prescription Issue >> Mar 23, 2023 10:44 AM Natalie Gibson wrote: Reason for CRM: Patient states she needs a prior authorization for Semaglutide,0.25 or 0.5MG /DOS, 2 MG/3ML SOPN in order for her insurance to cover it.

## 2023-03-23 NOTE — Progress Notes (Unsigned)
 SUBJECTIVE:   Chief Complaint  Patient presents with   Obesity    Pt wants to talk about starting [facility].   HPI Presents for acute visit  Discussed the use of AI scribe software for clinical note transcription with the patient, who gave verbal consent to proceed.  History of Present Illness The patient, with type 2 diabetes mellitus, presents with concerns about weight gain and diabetes management.  She has gained approximately 65 pounds over the past seven months and is seeking assistance with weight management. Despite walking and changing her eating habits to include spinach salads and salmon, she feels she needs additional help to manage her weight. She is interested in qualifying for a medication to aid in weight loss.  She has a history of diabetes and is currently taking metformin. Her last recorded A1c was 6.9, with a previous reading of 7.1, indicating minimal improvement. She is interested in starting Ozempic for diabetes management. She is aware of potential side effects such as constipation and nausea.  Her current medications include a blood pressure regimen consisting of a pink pill, a white pill, and a black pill taken weekly. She also takes Xanax at night and hydroxyzine twice daily, which she was advised could be taken every eight hours as needed. She is trying to reduce her Xanax use.  She reports a history of eye issues that have impacted her ability to work, stating 'I can't work' and 'it took a lot of my life.' She mentions having been evaluated by eye specialists sent by disability services. She also notes a recent fall while walking, resulting in a twisted ankle, but she continues to walk regularly.  She has reduced her cigarette smoking from a pack a day to three or four cigarettes daily.  In terms of family history, her sister has had thyroid issues but not thyroid cancer. She has no personal history of pancreatitis.      PERTINENT PMH / PSH: As  above  OBJECTIVE:  BP 120/66   Pulse 84   Temp 98.4 F (36.9 C)   Resp 20   Ht 5\' 7"  (1.702 m)   Wt 262 lb 2 oz (118.9 kg)   SpO2 95%   BMI 41.05 kg/m    Physical Exam Vitals reviewed.  Constitutional:      General: She is not in acute distress.    Appearance: Normal appearance. She is obese. She is not ill-appearing, toxic-appearing or diaphoretic.  Eyes:     General:        Right eye: No discharge.        Left eye: No discharge.     Conjunctiva/sclera: Conjunctivae normal.  Cardiovascular:     Rate and Rhythm: Normal rate and regular rhythm.     Heart sounds: Normal heart sounds.  Pulmonary:     Effort: Pulmonary effort is normal.     Breath sounds: Normal breath sounds.  Abdominal:     General: Bowel sounds are normal.  Musculoskeletal:        General: Normal range of motion.  Skin:    General: Skin is warm and dry.  Neurological:     General: No focal deficit present.     Mental Status: She is alert and oriented to person, place, and time. Mental status is at baseline.  Psychiatric:        Mood and Affect: Mood normal.        Behavior: Behavior normal.        Thought  Content: Thought content normal.        Judgment: Judgment normal.           03/23/2023    9:16 AM 12/29/2022    8:19 AM 12/01/2022    9:54 AM 10/29/2022    2:34 PM 10/01/2022    1:49 PM  Depression screen PHQ 2/9  Decreased Interest 2 0 3 3 3   Down, Depressed, Hopeless 0 2 1 2 3   PHQ - 2 Score 2 2 4 5 6   Altered sleeping 2 3 1 3 2   Tired, decreased energy 3 2 0 3 3  Change in appetite 3 3 3 3 1   Feeling bad or failure about yourself  1 3 0 0 3  Trouble concentrating 1 0 0 3 3  Moving slowly or fidgety/restless 0 0 0 0 0  Suicidal thoughts 0 0 0 0 0  PHQ-9 Score 12 13 8 17 18   Difficult doing work/chores Not difficult at all Extremely dIfficult Somewhat difficult Extremely dIfficult Extremely dIfficult      03/23/2023    9:16 AM 12/29/2022    8:19 AM 12/01/2022    9:54 AM  10/29/2022    2:34 PM  GAD 7 : Generalized Anxiety Score  Nervous, Anxious, on Edge 0 0 0 3  Control/stop worrying  3 3 3   Worry too much - different things 2 3 3 3   Trouble relaxing  3 3 3   Restless 0 0 3 3  Easily annoyed or irritable 3 3 3 3   Afraid - awful might happen 3 3 3 3   Total GAD 7 Score  15 18 21   Anxiety Difficulty Not difficult at all Extremely difficult Extremely difficult Very difficult    ASSESSMENT/PLAN:  Type 2 diabetes with complication (HCC) Assessment & Plan: Suboptimal glycemic control with recent HbA1c levels of 6.9% and 7.1%. Discussed Ozempic for improved control and weight loss. No contraindications for Ozempic noted. Emphasized dietary changes to enhance medication efficacy. - Order HbA1c at next visit - Start Ozempic 0.25 mg weekly. Sample provided - Educate on potential side effects of Ozempic, including constipation, bloating, and nausea. - Advise on dietary changes: increase fiber intake, avoid fried foods, and ensure adequate protein intake. - Schedule follow-up in 4 weeks to assess response to Ozempic.  Orders: -     Semaglutide(0.25 or 0.5MG /DOS); Inject 0.25 mg into the skin once a week. -     Semaglutide(0.25 or 0.5MG /DOS); Inject 0.25 mg into the skin once a week.  Dispense: 3 mL; Refill: 0  Hyperlipidemia associated with type 2 diabetes mellitus (HCC) -     Semaglutide(0.25 or 0.5MG /DOS); Inject 0.25 mg into the skin once a week. -     Semaglutide(0.25 or 0.5MG /DOS); Inject 0.25 mg into the skin once a week.  Dispense: 3 mL; Refill: 0  Morbid obesity (HCC) Assessment & Plan: Increased physical activity and dietary modifications noted. Regular walking and healthier diet choices implemented. - Encourage continued physical activity and dietary modifications. - Advise on portion control and balanced meals.  - Will benefit from GLP1 injection  Orders: -     Semaglutide(0.25 or 0.5MG /DOS); Inject 0.25 mg into the skin once a week. -      Semaglutide(0.25 or 0.5MG /DOS); Inject 0.25 mg into the skin once a week.  Dispense: 3 mL; Refill: 0  Mood disorder (HCC) Assessment & Plan: Currently managed with Xanax and hydroxyzine. Discussed gradual weaning off Xanax, but she is not ready due to sleep issues. - Continue Xanax  as currently prescribed. - Refill hydroxyzine 25 mg twice daily, with the option to take every eight hours as needed. - Discussed the goal of eventually weaning off Xanax.  Orders: -     hydrOXYzine Pamoate; Take 1 capsule (25 mg total) by mouth every 8 (eight) hours as needed.  Dispense: 90 capsule; Refill: 3  Tobacco use Assessment & Plan: Reduced smoking from a pack a day to three to four cigarettes daily. Encouraged further reduction and provided resources for support. Congratulated on reduction of smoking and decision to quit - Encourage continued reduction in smoking. - Recommend contacting 1-800-QUIT-NOW for additional support and resources.    PDMP reviewed  Return in about 4 weeks (around 04/20/2023) for PCP.  Dana Allan, MD

## 2023-03-23 NOTE — Telephone Encounter (Signed)
 PA request sent to PA team

## 2023-03-23 NOTE — Patient Instructions (Addendum)
 It was a pleasure meeting you today. Thank you for allowing me to take part in your health care.  Our goals for today as we discussed include:  Start Ozempic 0.25 mg weekly, sample provided  Follow up in 4 weeks Continue Metformin as prescribed  Will check blood work at next visit.    Refill sent for requested medication  This is a list of the screening recommended for you and due dates:  Health Maintenance  Topic Date Due   Eye exam for diabetics  Never done   Mammogram  Never done   COVID-19 Vaccine (3 - 2024-25 season) 09/13/2022   Zoster (Shingles) Vaccine (1 of 2) 03/29/2023*   DTaP/Tdap/Td vaccine (1 - Tdap) 12/29/2023*   Hemoglobin A1C  04/29/2023   Yearly kidney function blood test for diabetes  09/12/2023   Complete foot exam   10/29/2023   Yearly kidney health urinalysis for diabetes  12/01/2023   Pap with HPV screening  12/01/2027   Colon Cancer Screening  08/25/2032   Pneumococcal Vaccination  Completed   Flu Shot  Completed   Hepatitis C Screening  Completed   HIV Screening  Completed   HPV Vaccine  Aged Out  *Topic was postponed. The date shown is not the original due date.     If you have any questions or concerns, please do not hesitate to call the office at [phone].  I look forward to our next visit and until then take care and stay safe.  Regards,   Dana Allan, MD   [facility]

## 2023-03-24 ENCOUNTER — Telehealth: Payer: Self-pay

## 2023-03-24 ENCOUNTER — Other Ambulatory Visit (HOSPITAL_COMMUNITY): Payer: Self-pay

## 2023-03-24 NOTE — Telephone Encounter (Signed)
 Pharmacy Patient Advocate Encounter   Received notification from Pt Calls Messages that prior authorization for Ozempic (0.25 or 0.5 MG/DOSE) 2MG /3ML pen-injectors is required/requested.   Insurance verification completed.   The patient is insured through [facility] .   Per test claim: PA required; PA submitted to above mentioned insurance via CoverMyMeds Key/confirmation #/EOC [id number] Status is pending

## 2023-03-24 NOTE — Telephone Encounter (Signed)
 PA request has been Submitted. New Encounter has been or will be created for follow up. For additional info see Pharmacy Prior Auth telephone encounter from 03/24/2023.

## 2023-03-24 NOTE — Telephone Encounter (Signed)
 Noted.

## 2023-03-24 NOTE — Telephone Encounter (Signed)
 Pharmacy Patient Advocate Encounter  Received notification from [facility] that Prior Authorization for [facility] 0.25MG  has been APPROVED from 03/24/23 to 03/23/24. Ran test claim, Copay is $4.00. This test claim was processed through [facility]- copay amounts may vary at other pharmacies due to pharmacy/plan contracts, or as the patient moves through the different stages of their insurance plan.   PA #/Case ID/Reference #: [id number]

## 2023-03-26 ENCOUNTER — Encounter: Payer: Self-pay | Admitting: Family Medicine

## 2023-03-26 DIAGNOSIS — Z720 Tobacco use: Secondary | ICD-10-CM | POA: Insufficient documentation

## 2023-03-26 NOTE — Assessment & Plan Note (Signed)
 Currently managed with Xanax and hydroxyzine. Discussed gradual weaning off Xanax, but she is not ready due to sleep issues. - Continue Xanax as currently prescribed. - Refill hydroxyzine 25 mg twice daily, with the option to take every eight hours as needed. - Discussed the goal of eventually weaning off Xanax.

## 2023-03-26 NOTE — Assessment & Plan Note (Signed)
 Suboptimal glycemic control with recent HbA1c levels of 6.9% and 7.1%. Discussed Ozempic for improved control and weight loss. No contraindications for Ozempic noted. Emphasized dietary changes to enhance medication efficacy. - Order HbA1c at next visit - Start Ozempic 0.25 mg weekly. Sample provided - Educate on potential side effects of Ozempic, including constipation, bloating, and nausea. - Advise on dietary changes: increase fiber intake, avoid fried foods, and ensure adequate protein intake. - Schedule follow-up in 4 weeks to assess response to Ozempic.

## 2023-03-26 NOTE — Assessment & Plan Note (Signed)
 Reduced smoking from a pack a day to three to four cigarettes daily. Encouraged further reduction and provided resources for support. Congratulated on reduction of smoking and decision to quit - Encourage continued reduction in smoking. - Recommend contacting 1-800-QUIT-NOW for additional support and resources.

## 2023-03-26 NOTE — Assessment & Plan Note (Signed)
 Increased physical activity and dietary modifications noted. Regular walking and healthier diet choices implemented. - Encourage continued physical activity and dietary modifications. - Advise on portion control and balanced meals.  - Will benefit from GLP1 injection

## 2023-03-31 ENCOUNTER — Other Ambulatory Visit: Payer: Self-pay | Admitting: Family Medicine

## 2023-03-31 DIAGNOSIS — F39 Unspecified mood [affective] disorder: Secondary | ICD-10-CM

## 2023-04-16 ENCOUNTER — Other Ambulatory Visit: Payer: Self-pay | Admitting: Family Medicine

## 2023-04-16 DIAGNOSIS — E559 Vitamin D deficiency, unspecified: Secondary | ICD-10-CM

## 2023-04-16 DIAGNOSIS — F39 Unspecified mood [affective] disorder: Secondary | ICD-10-CM

## 2023-04-16 MED ORDER — ESCITALOPRAM OXALATE 10 MG PO TABS: 20.0000 mg | ORAL_TABLET | Freq: Every day | ORAL | 3 refills | Status: DC

## 2023-04-16 MED ORDER — VITAMIN D (ERGOCALCIFEROL) 50000 UNITS PO CAPS: 1.0000 | ORAL_CAPSULE | ORAL | 1 refills | Status: DC

## 2023-04-16 NOTE — Telephone Encounter (Signed)
 Copied from CRM [id number]. Topic: Clinical - Medication Refill >> Apr 16, 2023 10:56 AM Lennart Pall wrote: Most Recent Primary Care Visit:  Provider: Dana Allan  Department: [facility]  Visit Type: OFFICE VISIT  Date: 03/23/2023  Medication: Vitamin D, Ergocalciferol, 50000 units CAPS ; escitalopram (LEXAPRO) 10 MG tablet   Has the patient contacted their pharmacy? Yes (Agent: If no, request that the patient contact the pharmacy for the refill. If patient does not wish to contact the pharmacy document the reason why and proceed with request.) (Agent: If yes, when and what did the pharmacy advise?)  Is this the correct pharmacy for this prescription? Yes If no, delete pharmacy and type the correct one.  This is the patient's preferred pharmacy:  [facility] - [place], [place] - [address] Chew [place] [address] Phone: [phone] Fax: [phone]   Has the prescription been filled recently? Yes  Is the patient out of the medication? Yes  Has the patient been seen for an appointment in the last year OR does the patient have an upcoming appointment? Yes  Can we respond through MyChart? Yes  Agent: Please be advised that Rx refills may take up to 3 business days. We ask that you follow-up with your pharmacy.

## 2023-04-22 ENCOUNTER — Other Ambulatory Visit: Payer: Self-pay | Admitting: Family Medicine

## 2023-04-22 ENCOUNTER — Encounter: Payer: Self-pay | Admitting: Family Medicine

## 2023-04-22 ENCOUNTER — Ambulatory Visit: Admitting: Family Medicine

## 2023-04-22 VITALS — BP 124/70 | HR 97 | Temp 97.7°F | Resp 20 | Ht 67.0 in | Wt 257.0 lb

## 2023-04-22 DIAGNOSIS — F39 Unspecified mood [affective] disorder: Secondary | ICD-10-CM

## 2023-04-22 DIAGNOSIS — K5900 Constipation, unspecified: Secondary | ICD-10-CM | POA: Diagnosis not present

## 2023-04-22 DIAGNOSIS — E538 Deficiency of other specified B group vitamins: Secondary | ICD-10-CM | POA: Diagnosis not present

## 2023-04-22 DIAGNOSIS — E785 Hyperlipidemia, unspecified: Secondary | ICD-10-CM

## 2023-04-22 DIAGNOSIS — Z7985 Long-term (current) use of injectable non-insulin antidiabetic drugs: Secondary | ICD-10-CM

## 2023-04-22 DIAGNOSIS — E559 Vitamin D deficiency, unspecified: Secondary | ICD-10-CM | POA: Diagnosis not present

## 2023-04-22 DIAGNOSIS — E1169 Type 2 diabetes mellitus with other specified complication: Secondary | ICD-10-CM

## 2023-04-22 DIAGNOSIS — Z7984 Long term (current) use of oral hypoglycemic drugs: Secondary | ICD-10-CM

## 2023-04-22 DIAGNOSIS — E118 Type 2 diabetes mellitus with unspecified complications: Secondary | ICD-10-CM

## 2023-04-22 DIAGNOSIS — Z6841 Body Mass Index (BMI) 40.0 and over, adult: Secondary | ICD-10-CM | POA: Diagnosis not present

## 2023-04-22 LAB — LIPID PANEL
Cholesterol: 91 mg/dL (ref 0–200)
HDL: 36.6 mg/dL — ABNORMAL LOW (ref >39.00)
LDL Cholesterol: 25 mg/dL (ref 0–99)
NonHDL: 54.22
Total CHOL/HDL Ratio: 2
Triglycerides: 147 mg/dL (ref 0.0–149.0)
VLDL: 29.4 mg/dL (ref 0.0–40.0)

## 2023-04-22 LAB — CBC WITH DIFFERENTIAL/PLATELET
Basophils Absolute: 0 10*3/uL (ref 0.0–0.1)
Basophils Relative: 0.7 % (ref 0.0–3.0)
Eosinophils Absolute: 0.2 10*3/uL (ref 0.0–0.7)
Eosinophils Relative: 2.8 % (ref 0.0–5.0)
HCT: 42.7 % (ref 36.0–46.0)
Hemoglobin: 14.1 g/dL (ref 12.0–15.0)
Lymphocytes Relative: 38 % (ref 12.0–46.0)
Lymphs Abs: 2.4 10*3/uL (ref 0.7–4.0)
MCHC: 33 g/dL (ref 30.0–36.0)
MCV: 92.6 fl (ref 78.0–100.0)
Monocytes Absolute: 0.3 10*3/uL (ref 0.1–1.0)
Monocytes Relative: 4.8 % (ref 3.0–12.0)
Neutro Abs: 3.5 10*3/uL (ref 1.4–7.7)
Neutrophils Relative %: 53.7 % (ref 43.0–77.0)
Platelets: 273 10*3/uL (ref 150.0–400.0)
RBC: 4.61 Mil/uL (ref 3.87–5.11)
RDW: 14.2 % (ref 11.5–15.5)
WBC: 6.4 10*3/uL (ref 4.0–10.5)

## 2023-04-22 LAB — COMPREHENSIVE METABOLIC PANEL WITH GFR
ALT: 15 U/L (ref 0–35)
AST: 13 U/L (ref 0–37)
Albumin: 4.6 g/dL (ref 3.5–5.2)
Alkaline Phosphatase: 81 U/L (ref 39–117)
BUN: 14 mg/dL (ref 6–23)
CO2: 27 meq/L (ref 19–32)
Calcium: 9.5 mg/dL (ref 8.4–10.5)
Chloride: 105 meq/L (ref 96–112)
Creatinine, Ser: 0.73 mg/dL (ref 0.40–1.20)
GFR: 88.93 mL/min (ref >60.00)
Glucose, Bld: 131 mg/dL — ABNORMAL HIGH (ref 70–99)
Potassium: 4.5 meq/L (ref 3.5–5.1)
Sodium: 140 meq/L (ref 135–145)
Total Bilirubin: 0.3 mg/dL (ref 0.2–1.2)
Total Protein: 7.8 g/dL (ref 6.0–8.3)

## 2023-04-22 LAB — POCT GLYCOSYLATED HEMOGLOBIN (HGB A1C): Hemoglobin A1C: 6.9 % — AB (ref 4.0–5.6)

## 2023-04-22 LAB — VITAMIN B12: Vitamin B-12: 489 pg/mL (ref 211–911)

## 2023-04-22 LAB — VITAMIN D 25 HYDROXY (VIT D DEFICIENCY, FRACTURES): VITD: 33.83 ng/mL (ref 30.00–100.00)

## 2023-04-22 LAB — TSH: TSH: 0.91 u[IU]/mL (ref 0.35–5.50)

## 2023-04-22 MED ORDER — LINACLOTIDE 72 MCG PO CAPS: 72.0000 ug | ORAL_CAPSULE | Freq: Every day | ORAL | 0 refills | Status: DC

## 2023-04-22 MED ORDER — SEMAGLUTIDE(0.25 OR 0.5MG/DOS) 2 MG/3ML ~~LOC~~ SOPN: 0.5000 mg | PEN_INJECTOR | SUBCUTANEOUS | 1 refills | Status: DC

## 2023-04-22 NOTE — Patient Instructions (Addendum)
 It was a pleasure meeting you today. Thank you for allowing me to take part in your health care.  Our goals for today as we discussed include:  We will get some labs today.  If they are abnormal or we need to do something about them, I will call you.  If they are normal, I will send you a message on MyChart (if it is active) or a letter in the mail.  If you don't hear from [place] in 2 weeks, please call the office at the number below.   A1c 6.9.  No change from last check  Increase Ozempic to 0.5 mg weekly  Trial Linzess 72 mg daily for constipation  Referral sent for Mammogram . Please call to schedule appointment. [facility] [address] [place], [place] [address] [phone]     This is a list of the screening recommended for you and due dates:  Health Maintenance  Topic Date Due   Eye exam for diabetics  Never done   Mammogram  Never done   Zoster (Shingles) Vaccine (1 of 2) Never done   COVID-19 Vaccine (4 - 2024-25 season) 09/13/2022   DTaP/Tdap/Td vaccine (1 - Tdap) 12/29/2023*   Hemoglobin A1C  04/29/2023   Flu Shot  08/13/2023   Yearly kidney function blood test for diabetes  09/12/2023   Complete foot exam   10/29/2023   Yearly kidney health urinalysis for diabetes  12/01/2023   Pap with HPV screening  12/01/2027   Colon Cancer Screening  08/25/2032   Pneumococcal Vaccination  Completed   Hepatitis C Screening  Completed   HIV Screening  Completed   HPV Vaccine  Aged Out   Meningitis B Vaccine  Aged Out  *Topic was postponed. The date shown is not the original due date.      If you have any questions or concerns, please do not hesitate to call the office at [phone].  I look forward to our next visit and until then take care and stay safe.  Regards,   Dana Allan, MD   [facility]

## 2023-04-22 NOTE — Progress Notes (Signed)
 SUBJECTIVE:   Chief Complaint  Patient presents with   Medical Management of Chronic Issues    4 week follow up on Ozempic    HPI Presents for follow up chronic disease management  Discussed the use of AI scribe software for clinical note transcription with the patient, who gave verbal consent to proceed.  History of Present Illness Natalie Gibson is a 63 year old female with diabetes who presents for a follow-up after starting Ozempic .  She is four weeks into her treatment with Ozempic  for diabetes management and has experienced a weight loss of five pounds. She administers Ozempic  using a pen and has increased the dose from 0.25 mg to 0.5 mg. She continues to take metformin , although her current A1c level is pending. She fasted since yesterday afternoon in preparation for blood work today.  She experiences constipation, ongoing for one to two years, with bowel movements occurring every four days. Despite dietary adjustments, including regular salad consumption and reduced chocolate intake, the constipation persists. She has tried MiraLAX, which she has at home, but does not use it regularly. She has used Linzess , prescribed to her sister, which provided relief within an hour. She notes her infrequent bowel movements compared to her sister, who has daily bowel movements.  She is on Lexapro  (escitalopram ) for mood management, with a recent increase in dosage to 20 mg. There is confusion regarding the current prescription status.    PERTINENT PMH / PSH: As above  OBJECTIVE:  BP 124/70   Pulse 97   Temp 97.7 F (36.5 C)   Resp 20   Ht 5\' 7"  (1.702 m)   Wt 257 lb (116.6 kg)   SpO2 95%   BMI 40.25 kg/m    Physical Exam Vitals reviewed.  Constitutional:      General: She is not in acute distress.    Appearance: Normal appearance. She is obese. She is not ill-appearing, toxic-appearing or diaphoretic.  Eyes:     General:        Right eye: No discharge.        Left eye: No  discharge.     Conjunctiva/sclera: Conjunctivae normal.  Cardiovascular:     Rate and Rhythm: Normal rate and regular rhythm.     Heart sounds: Normal heart sounds.  Pulmonary:     Effort: Pulmonary effort is normal.     Breath sounds: Normal breath sounds.  Abdominal:     General: Bowel sounds are normal. There is no distension.     Palpations: There is no mass.     Tenderness: There is no abdominal tenderness.  Musculoskeletal:        General: Normal range of motion.  Skin:    General: Skin is warm and dry.  Neurological:     General: No focal deficit present.     Mental Status: She is alert and oriented to person, place, and time. Mental status is at baseline.  Psychiatric:        Mood and Affect: Mood normal.        Behavior: Behavior normal.        Thought Content: Thought content normal.        Judgment: Judgment normal.           03/23/2023    9:16 AM 12/29/2022    8:19 AM 12/01/2022    9:54 AM 10/29/2022    2:34 PM 10/01/2022    1:49 PM  Depression screen PHQ 2/9  Decreased Interest 2 0 3  3 3  Down, Depressed, Hopeless 0 2 1 2 3   PHQ - 2 Score 2 2 4 5 6   Altered sleeping 2 3 1 3 2   Tired, decreased energy 3 2 0 3 3  Change in appetite 3 3 3 3 1   Feeling bad or failure about yourself  1 3 0 0 3  Trouble concentrating 1 0 0 3 3  Moving slowly or fidgety/restless 0 0 0 0 0  Suicidal thoughts 0 0 0 0 0  PHQ-9 Score 12 13 8 17 18   Difficult doing work/chores Not difficult at all Extremely dIfficult Somewhat difficult Extremely dIfficult Extremely dIfficult      03/23/2023    9:16 AM 12/29/2022    8:19 AM 12/01/2022    9:54 AM 10/29/2022    2:34 PM  GAD 7 : Generalized Anxiety Score  Nervous, Anxious, on Edge 0 0 0 3  Control/stop worrying  3 3 3   Worry too much - different things 2 3 3 3   Trouble relaxing  3 3 3   Restless 0 0 3 3  Easily annoyed or irritable 3 3 3 3   Afraid - awful might happen 3 3 3 3   Total GAD 7 Score  15 18 21   Anxiety Difficulty  Not difficult at all Extremely difficult Extremely difficult Very difficult    ASSESSMENT/PLAN:  Type 2 diabetes with complication [facility]) Assessment & Plan: Managed with Ozempic  and Metformin . Weight loss observed. A1c level pending for further Metformin  management. - Check A1c level today. - Increase Ozempic  dose to 0.5 mg weekly - Continue Metformin , consider reducing dose based on A1c results. - Order blood work.  Orders: -     POCT glycosylated hemoglobin (Hb A1C) -     CBC with Differential/Platelet -     Comprehensive metabolic panel with GFR -     Semaglutide (0.25 or 0.5MG /DOS); Inject 0.5 mg into the skin once a week.  Dispense: 3 mL; Refill: 1  Morbid obesity (HCC) -     Semaglutide (0.25 or 0.5MG /DOS); Inject 0.5 mg into the skin once a week.  Dispense: 3 mL; Refill: 1  Hyperlipidemia associated with type 2 diabetes mellitus (HCC) Assessment & Plan: New diagnosis LDL>100.  Goal <70 DM T2 Continue Crestor  20 mg daily Encourage tobacco cessation    Orders: -     Lipid panel -     Semaglutide (0.25 or 0.5MG /DOS); Inject 0.5 mg into the skin once a week.  Dispense: 3 mL; Refill: 1  Mood disorder (HCC) Assessment & Plan: On Lexapro . - Continue Lexapro  20 mg daily   Constipation, unspecified constipation type Assessment & Plan: Chronic constipation unresponsive to dietary changes and MiraLAX. Linzess  previously effective. - Prescribe Linzess  72 mg daily - Monitor response to Linzess .  Orders: -     TSH -     linaCLOtide ; Take 1 capsule (72 mcg total) by mouth daily before breakfast.  Dispense: 30 capsule; Refill: 0  Vitamin D  deficiency -     VITAMIN D  25 Hydroxy (Vit-D Deficiency, Fractures)  Vitamin B 12 deficiency -     Vitamin B12    PDMP reviewed  Return in about 3 months (around 07/22/2023) for PCP,DM.  Valli Gaw, MD

## 2023-05-02 ENCOUNTER — Encounter: Payer: Self-pay | Admitting: Family Medicine

## 2023-05-02 DIAGNOSIS — K5900 Constipation, unspecified: Secondary | ICD-10-CM | POA: Insufficient documentation

## 2023-05-02 DIAGNOSIS — E538 Deficiency of other specified B group vitamins: Secondary | ICD-10-CM | POA: Insufficient documentation

## 2023-05-02 NOTE — Assessment & Plan Note (Signed)
 On Lexapro . - Continue Lexapro  20 mg daily

## 2023-05-02 NOTE — Assessment & Plan Note (Signed)
 Managed with Ozempic  and Metformin . Weight loss observed. A1c level pending for further Metformin  management. - Check A1c level today. - Increase Ozempic  dose to 0.5 mg weekly - Continue Metformin , consider reducing dose based on A1c results. - Order blood work.

## 2023-05-02 NOTE — Assessment & Plan Note (Signed)
 Chronic constipation unresponsive to dietary changes and MiraLAX. Linzess  previously effective. - Prescribe Linzess  72 mg daily - Monitor response to Linzess .

## 2023-05-02 NOTE — Assessment & Plan Note (Signed)
 New diagnosis LDL>100.  Goal <70 DM T2 Continue Crestor  20 mg daily Encourage tobacco cessation

## 2023-06-08 ENCOUNTER — Other Ambulatory Visit: Payer: Self-pay | Admitting: Family Medicine

## 2023-06-08 DIAGNOSIS — E1169 Type 2 diabetes mellitus with other specified complication: Secondary | ICD-10-CM

## 2023-06-08 DIAGNOSIS — E118 Type 2 diabetes mellitus with unspecified complications: Secondary | ICD-10-CM

## 2023-06-16 ENCOUNTER — Other Ambulatory Visit: Payer: Self-pay | Admitting: Family Medicine

## 2023-06-16 DIAGNOSIS — F39 Unspecified mood [affective] disorder: Secondary | ICD-10-CM

## 2023-06-23 ENCOUNTER — Other Ambulatory Visit: Payer: Self-pay | Admitting: Family Medicine

## 2023-06-23 DIAGNOSIS — K5900 Constipation, unspecified: Secondary | ICD-10-CM

## 2023-07-29 DIAGNOSIS — H401133 Primary open-angle glaucoma, bilateral, severe stage: Secondary | ICD-10-CM | POA: Diagnosis not present

## 2023-07-29 DIAGNOSIS — H35351 Cystoid macular degeneration, right eye: Secondary | ICD-10-CM | POA: Diagnosis not present

## 2023-08-31 DIAGNOSIS — H401133 Primary open-angle glaucoma, bilateral, severe stage: Secondary | ICD-10-CM | POA: Diagnosis not present

## 2023-09-07 ENCOUNTER — Other Ambulatory Visit: Payer: Self-pay

## 2023-09-07 DIAGNOSIS — K5900 Constipation, unspecified: Secondary | ICD-10-CM

## 2023-09-07 MED ORDER — LINACLOTIDE 72 MCG PO CAPS
72.0000 ug | ORAL_CAPSULE | Freq: Every day | ORAL | 0 refills | Status: DC
Start: 2023-09-07 — End: 2023-09-23

## 2023-09-14 DIAGNOSIS — H35351 Cystoid macular degeneration, right eye: Secondary | ICD-10-CM | POA: Diagnosis not present

## 2023-09-20 ENCOUNTER — Other Ambulatory Visit: Payer: Self-pay

## 2023-09-20 DIAGNOSIS — F39 Unspecified mood [affective] disorder: Secondary | ICD-10-CM

## 2023-09-20 DIAGNOSIS — E118 Type 2 diabetes mellitus with unspecified complications: Secondary | ICD-10-CM

## 2023-09-20 MED ORDER — ALPRAZOLAM 0.25 MG PO TABS: 0.2500 mg | ORAL_TABLET | Freq: Two times a day (BID) | ORAL | 0 refills | Status: DC | PRN

## 2023-09-20 MED ORDER — OZEMPIC (0.25 OR 0.5 MG/DOSE) 2 MG/3ML ~~LOC~~ SOPN: 0.5000 mg | PEN_INJECTOR | SUBCUTANEOUS | 0 refills | Status: DC

## 2023-09-20 NOTE — Telephone Encounter (Signed)
 Pt is requesting a refill of the Alprazolam , last refilled on 03/31/2023 to get her through till her appt with Dr. Abbey on 09/23/2023. Pt stated that her mother passed.  Pt is also requesting a refill of the Ozempic  last refill on 06/08/2023  Last OV: 04/22/2023 Next OV: 09/23/2023

## 2023-09-20 NOTE — Telephone Encounter (Unsigned)
 Copied from CRM [id number]. Topic: Clinical - Medication Refill >> Sep 20, 2023 12:24 PM Mercedes MATSU wrote: Medication: ALPRAZolam  (XANAX ) 0.25 MG tablet  OZEMPIC , 0.25 OR 0.5 MG/DOSE, 2 MG/3ML SOPN     Has the patient contacted their pharmacy? Yes (Agent: If no, request that the patient contact the pharmacy for the refill. If patient does not wish to contact the pharmacy document the reason why and proceed with request.) (Agent: If yes, when and what did the pharmacy advise?)  This is the patient's preferred pharmacy:  [facility] - [place], [place] - [address] [place] [address] Phone: [phone] Fax: [phone]  Is this the correct pharmacy for this prescription? Yes If no, delete pharmacy and type the correct one.   Has the prescription been filled recently? Yes  Is the patient out of the medication? Yes  Has the patient been seen for an appointment in the last year OR does the patient have an upcoming appointment? Yes  Can we respond through MyChart? Yes  Agent: Please be advised that Rx refills may take up to 3 business days. We ask that you follow-up with your pharmacy.

## 2023-09-20 NOTE — Telephone Encounter (Signed)
 1. Mood disorder (HCC) - PDMP reviewed. 10 tablets of Xanax  0.25 sent to the pharmacy. This should last her till her visit with me on 09/23/23.  - ALPRAZolam  (XANAX ) 0.25 MG tablet; Take 1 tablet (0.25 mg total) by mouth 2 (two) times daily as needed. for anxiety  Dispense: 10 tablet; Refill: 0.    2. Type 2 diabetes with complication (HCC) - Semaglutide ,0.25 or 0.5MG /DOS, (OZEMPIC , 0.25 OR 0.5 MG/DOSE,) 2 MG/3ML SOPN; Inject 0.5 mg into the skin once a week.  Dispense: 3 mL; Refill: 0  Luke Shade, MD

## 2023-09-23 ENCOUNTER — Ambulatory Visit

## 2023-09-23 VITALS — BP 136/78 | HR 78 | Temp 98.0°F | Ht 67.0 in | Wt 241.2 lb

## 2023-09-23 DIAGNOSIS — Z720 Tobacco use: Secondary | ICD-10-CM | POA: Diagnosis not present

## 2023-09-23 DIAGNOSIS — E118 Type 2 diabetes mellitus with unspecified complications: Secondary | ICD-10-CM | POA: Diagnosis not present

## 2023-09-23 DIAGNOSIS — F39 Unspecified mood [affective] disorder: Secondary | ICD-10-CM

## 2023-09-23 DIAGNOSIS — F5101 Primary insomnia: Secondary | ICD-10-CM | POA: Diagnosis not present

## 2023-09-23 DIAGNOSIS — K5900 Constipation, unspecified: Secondary | ICD-10-CM | POA: Diagnosis not present

## 2023-09-23 DIAGNOSIS — E559 Vitamin D deficiency, unspecified: Secondary | ICD-10-CM | POA: Diagnosis not present

## 2023-09-23 DIAGNOSIS — E1169 Type 2 diabetes mellitus with other specified complication: Secondary | ICD-10-CM

## 2023-09-23 DIAGNOSIS — Z7984 Long term (current) use of oral hypoglycemic drugs: Secondary | ICD-10-CM | POA: Diagnosis not present

## 2023-09-23 DIAGNOSIS — Z7985 Long-term (current) use of injectable non-insulin antidiabetic drugs: Secondary | ICD-10-CM

## 2023-09-23 DIAGNOSIS — E785 Hyperlipidemia, unspecified: Secondary | ICD-10-CM

## 2023-09-23 MED ORDER — ALPRAZOLAM 0.25 MG PO TABS: 0.2500 mg | ORAL_TABLET | Freq: Every day | ORAL | 0 refills | Status: DC | PRN

## 2023-09-23 MED ORDER — OZEMPIC (0.25 OR 0.5 MG/DOSE) 2 MG/3ML ~~LOC~~ SOPN: 0.5000 mg | PEN_INJECTOR | SUBCUTANEOUS | 3 refills | Status: AC

## 2023-09-23 MED ORDER — ESCITALOPRAM OXALATE 20 MG PO TABS
20.0000 mg | ORAL_TABLET | Freq: Every day | ORAL | 3 refills | Status: AC
Start: 2023-09-23 — End: ?

## 2023-09-23 MED ORDER — METFORMIN HCL ER 500 MG PO TB24: 500.0000 mg | ORAL_TABLET | Freq: Two times a day (BID) | ORAL | 3 refills | Status: AC

## 2023-09-23 MED ORDER — TRAZODONE HCL 50 MG PO TABS: 50.0000 mg | ORAL_TABLET | Freq: Every day | ORAL | 1 refills | Status: DC

## 2023-09-23 MED ORDER — ROSUVASTATIN CALCIUM 20 MG PO TABS
20.0000 mg | ORAL_TABLET | Freq: Every day | ORAL | 3 refills | Status: AC
Start: 2023-09-23 — End: ?

## 2023-09-23 MED ORDER — VITAMIN D (ERGOCALCIFEROL) 50000 UNITS PO CAPS: 1.0000 | ORAL_CAPSULE | ORAL | 1 refills | Status: AC

## 2023-09-23 MED ORDER — LINACLOTIDE 72 MCG PO CAPS: 72.0000 ug | ORAL_CAPSULE | Freq: Every day | ORAL | 0 refills | Status: DC | PRN

## 2023-09-23 NOTE — Assessment & Plan Note (Signed)
 Chronic, no SI, HI.  Continue Escitalopram  20 mg daily, refill sent.  Worsening insomnia, anxiety, depression in leu of loss of her mother in July 2025. 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  Currently not interested in seeing psychiatrist, psychologist. Reevaluate in 3 months and consider referral to behavioral health department.

## 2023-09-23 NOTE — Assessment & Plan Note (Addendum)
 Worsening insomnia, anxiety in leu of loss of her mother in July 2025. 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  Currently not interested in seeing psychiatrist, psychologist. Reevaluate in 3 months and consider referral to behavioral health department.  Taking Xanax  0.25 mg, 2 tablets later in the afternoon which has helped with insomnia. Does not remember if Hydroxyzine  was helpful.  Counseled on prolong use of benzodiazepine use, habit forming, need for urine drug screening with the patient. PDMP reviewed. Counseled on tapering down: Start taking Xanax  0.25 mg, daily as needed at bedtime. 3 M refill sent.  F/U in 3 months, will update UDS during her next office visit. No refill till patient is seen in the clinic.  Start Trazodone  50 mg at night. Sleep hygiene to help.

## 2023-09-23 NOTE — Assessment & Plan Note (Signed)
 Patient is back to smoking 1/2 pack since losing her mom in July. Encouraged behavioral and pharmacological intervention to assist with smoking cessation but she is not interested in smoking cessation intervention today. Will continue to follow up.

## 2023-09-23 NOTE — Assessment & Plan Note (Signed)
 Continue Vitamin D  1.25 mg weekly, refill sent.

## 2023-09-23 NOTE — Assessment & Plan Note (Signed)
 Chronic constipation with intermittent worsening constipation not unresponsive to dietary changes and MiraLAX. Linzess  72 mg daily as needed in the morning, 30 minutes before food. F/U if worsening constipation.

## 2023-09-23 NOTE — Assessment & Plan Note (Signed)
 Chronic, LDL <70. 04/22/2023 with LDL 25, continue Crestor  20 mg daily.

## 2023-09-23 NOTE — Assessment & Plan Note (Addendum)
 Managed with Ozempic  0.5 mg once weekly, continue tolerating well and Metformin  500 mg, twice a day. A1c from 04/22/23 6.9%, repeat A1c during follow up visit. F/U with [facility] for eye concerns. Due for diabetic kidney exam, patient declined today, will revisit this during follow up.

## 2023-09-23 NOTE — Patient Instructions (Addendum)
-   Reduce Xanax  to 0.25 mg, take one tablet at bed time. Start taking Trazadone 50 mg at bedtime to help with sleep. Stop Hydroxyzine . We will need to update lab and urine test during your follow up visit. I won't be able to refill Xanax  without seeing you in 3 months. If you continue to have sleep, low mood as discussed during your visit today I will refer you to psychiatrist and psychologist.    - Continue Ozempin 0.5 mg and Metformin  500 mg twice a day.   - Follow up in 3 months.

## 2023-09-23 NOTE — Progress Notes (Signed)
 Established Patient Office Visit TOC from Dr. Hope    Subjective  Patient ID: Ronal Aran, female    DOB: Dec 04, 1960  Age: 63 y.o. MRN: [id number]  Chief Complaint  Patient presents with   Establish Care   Diabetes    Patient most recent Diabetic Eye Exam was requested at today's visit.   Eye Problem   Depression    She  has a past medical history of Diabetes mellitus without complication (HCC), Glaucoma, and Migraine.  HPI Discussed the use of AI scribe software for clinical note transcription with the patient, who gave verbal consent to proceed.  History of Present Illness Jelene Albano is a 63 year old female with glaucoma,  cataracts, diabetes, obesity, insomnia who presents for chronic medication management.  - She underwent cataract and glaucoma surgery on her left eye in August of last year. Her vision is blurry in the right eye is blurry, and she is receiving steroid injections to address swelling at the back of the eye. She is under the care of a new ophthalmologist at [facility].   - She has a history of diabetes and is currently on Ozempic  0.5 mg, which has helped her lose weight as well. She also takes Metformin  500 mg twice daily.  - She has been experiencing significant anxiety and stress, particularly following the recent death of her mother, whom she cared for over the last fourteen years. She reports having anxiety attacks and uses Xanax , taking two tablets in the afternoon to help manage her symptoms. She also takes Lexapro  20 mg and is unsure if she is taking hydroxyzine , although she is unsure of the specific medications and their purposes. Xanax  helps her sleep and relax, especially when her vision worsens in the evening.  - She has a long history of smoking, currently about half a pack per day, and has been smoking since she was 63 years old. She has attempted to quit but finds it difficult due to her current stressors. She uses marijuana occasionally to  help with appetite and relaxation, especially after her mother's passing.  - She takes Crestor  for cholesterol management, daily, and Linzess  as needed for constipation, which she attributes to irregular eating habits since her mother's passing.  ROS As per HPI    Objective:     BP 136/78 (BP Location: Left Arm, Patient Position: Sitting, Cuff Size: Normal)   Pulse 78   Temp 98 F (36.7 C) (Oral)   Ht 5' 7 (1.702 m)   Wt 241 lb 3.2 oz (109.4 kg)   SpO2 97%   BMI 37.78 kg/m      09/23/2023   10:46 AM 03/23/2023    9:16 AM 12/29/2022    8:19 AM  Depression screen PHQ 2/9  Decreased Interest 3 2 0  Down, Depressed, Hopeless 0 0 2  PHQ - 2 Score 3 2 2   Altered sleeping 3 2 3   Tired, decreased energy 3 3 2   Change in appetite 2 3 3   Feeling bad or failure about yourself  0 1 3  Trouble concentrating 0 1 0  Moving slowly or fidgety/restless 0 0 0  Suicidal thoughts 0 0 0  PHQ-9 Score 11 12 13   Difficult doing work/chores Very difficult Not difficult at all Extremely dIfficult      03/23/2023    9:16 AM 12/29/2022    8:19 AM 12/01/2022    9:54 AM 10/29/2022    2:34 PM  GAD 7 : Generalized Anxiety Score  Nervous, Anxious, on Edge 0 0 0 3  Control/stop worrying  3 3 3   Worry too much - different things 2 3 3 3   Trouble relaxing  3 3 3   Restless 0 0 3 3  Easily annoyed or irritable 3 3 3 3   Afraid - awful might happen 3 3 3 3   Total GAD 7 Score  15 18 21   Anxiety Difficulty Not difficult at all Extremely difficult Extremely difficult Very difficult      09/23/2023   10:46 AM 03/23/2023    9:16 AM 12/29/2022    8:19 AM  Depression screen PHQ 2/9  Decreased Interest 3 2 0  Down, Depressed, Hopeless 0 0 2  PHQ - 2 Score 3 2 2   Altered sleeping 3 2 3   Tired, decreased energy 3 3 2   Change in appetite 2 3 3   Feeling bad or failure about yourself  0 1 3  Trouble concentrating 0 1 0  Moving slowly or fidgety/restless 0 0 0  Suicidal thoughts 0 0 0  PHQ-9 Score 11 12  13   Difficult doing work/chores Very difficult Not difficult at all Extremely dIfficult      03/23/2023    9:16 AM 12/29/2022    8:19 AM 12/01/2022    9:54 AM 10/29/2022    2:34 PM  GAD 7 : Generalized Anxiety Score  Nervous, Anxious, on Edge 0 0 0 3  Control/stop worrying  3 3 3   Worry too much - different things 2 3 3 3   Trouble relaxing  3 3 3   Restless 0 0 3 3  Easily annoyed or irritable 3 3 3 3   Afraid - awful might happen 3 3 3 3   Total GAD 7 Score  15 18 21   Anxiety Difficulty Not difficult at all Extremely difficult Extremely difficult Very difficult   SDOH Screenings   Depression (PHQ2-9): High Risk (09/23/2023)  Tobacco Use: High Risk (09/23/2023)     Physical Exam Constitutional:      Appearance: She is obese.  HENT:     Head: Normocephalic and atraumatic.     Right Ear: Tympanic membrane normal.     Left Ear: Tympanic membrane normal.     Mouth/Throat:     Mouth: Mucous membranes are moist.  Cardiovascular:     Rate and Rhythm: Normal rate.  Pulmonary:     Effort: Pulmonary effort is normal.     Breath sounds: Normal breath sounds.  Abdominal:     General: Bowel sounds are normal.     Palpations: Abdomen is soft.     Tenderness: There is no abdominal tenderness. There is no guarding.  Musculoskeletal:     Cervical back: Neck supple.     Right lower leg: No edema.     Left lower leg: No edema.  Skin:    General: Skin is warm.  Neurological:     Mental Status: She is alert and oriented to person, place, and time.  Psychiatric:        Mood and Affect: Mood normal.        No results found for any visits on 09/23/23.  The ASCVD Risk score (Arnett DK, et al., 2019) failed to calculate for the following reasons:   The valid total cholesterol range is 130 to 320 mg/dL     Assessment & Plan:   Mood disorder [facility]) Assessment & Plan: Chronic, no SI, HI.  Continue Escitalopram  20 mg daily, refill sent.  Worsening insomnia, anxiety, depression in  leu of loss of her mother in July 2025. 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  Currently not interested in seeing psychiatrist, psychologist. Reevaluate in 3 months and consider referral to behavioral health department.    Orders: -     ALPRAZolam ; Take 1 tablet (0.25 mg total) by mouth daily as needed. for anxiety, panic  Dispense: 30 tablet; Refill: 0 -     ALPRAZolam ; Take 1 tablet (0.25 mg total) by mouth daily as needed. for anxiety, panic  Dispense: 30 tablet; Refill: 0 -     ALPRAZolam ; Take 1 tablet (0.25 mg total) by mouth daily as needed. for anxiety, panic  Dispense: 30 tablet; Refill: 0 -     Escitalopram  Oxalate; Take 1 tablet (20 mg total) by mouth daily.  Dispense: 90 tablet; Refill: 3  Constipation, unspecified constipation type Assessment & Plan: Chronic constipation with intermittent worsening constipation not unresponsive to dietary changes and MiraLAX. Linzess  72 mg daily as needed in the morning, 30 minutes before food. F/U if worsening constipation.    Orders: -     linaCLOtide ; Take 1 capsule (72 mcg total) by mouth daily as needed.  Dispense: 30 capsule; Refill: 0  Type 2 diabetes with complication [facility]) Assessment & Plan: Managed with Ozempic  0.5 mg once weekly, continue tolerating well and Metformin  500 mg, twice a day. A1c from 04/22/23 6.9%, repeat A1c during follow up visit. F/U with [facility] for eye concerns.   Orders: -     metFORMIN  HCl ER; Take 1 tablet (500 mg total) by mouth 2 (two) times daily with a meal.  Dispense: 180 tablet; Refill: 3 -     Ozempic  (0.25 or 0.5 MG/DOSE); Inject 0.5 mg into the skin once a week.  Dispense: 3 mL; Refill: 3  Hyperlipidemia associated with type 2 diabetes mellitus (HCC) Assessment & Plan: Chronic, LDL <70. 04/22/2023 with LDL 25, continue Crestor  20 mg daily.   Orders: -     Rosuvastatin  Calcium ; Take 1 tablet (20 mg total) by mouth daily.  Dispense: 90 tablet; Refill: 3  Morbid obesity (HCC) Assessment &  Plan:  Encourage continued physical activity and dietary modifications during today's visit.     Vitamin D  deficiency Assessment & Plan: Continue Vitamin D  1.25 mg weekly, refill sent.   Orders: -     Vitamin D  (Ergocalciferol ); Take 1 capsule by mouth every 7 (seven) days.  Dispense: 12 capsule; Refill: 1  Primary insomnia Assessment & Plan: Worsening insomnia, anxiety in leu of loss of her mother in July 2025. 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  Currently not interested in seeing psychiatrist, psychologist. Reevaluate in 3 months and consider referral to behavioral health department.  Taking Xanax  0.25 mg, 2 tablets later in the afternoon which has helped with insomnia. Does not remember if Hydroxyzine  was helpful.  Counseled on prolong use of benzodiazepine use, habit forming, need for urine drug screening with the patient. PDMP reviewed. Counseled on tapering down: Start taking Xanax  0.25 mg, daily as needed at bedtime. 3 M refill sent.  F/U in 3 months, will update UDS during her next office visit. No refill till patient is seen in the clinic.  Start Trazodone  50 mg at night. Sleep hygiene to help.   Orders: -     traZODone  HCl; Take 1 tablet (50 mg total) by mouth at bedtime.  Dispense: 90 tablet; Refill: 1  Tobacco use Assessment & Plan: Patient is back to smoking 1/2 pack since losing her mom in July. Encouraged  behavioral and pharmacological intervention to assist with smoking cessation but she is not interested in smoking cessation intervention today. Will continue to follow up.     Return in about 3 months (around 12/23/2023) for Chronic (DM, mood follow up).   Luke Shade, MD

## 2023-09-23 NOTE — Assessment & Plan Note (Signed)
 Encourage continued physical activity and dietary modifications during today's visit.

## 2023-10-11 DIAGNOSIS — H35351 Cystoid macular degeneration, right eye: Secondary | ICD-10-CM | POA: Diagnosis not present

## 2023-10-11 DIAGNOSIS — H2512 Age-related nuclear cataract, left eye: Secondary | ICD-10-CM | POA: Diagnosis not present

## 2023-10-11 DIAGNOSIS — Z961 Presence of intraocular lens: Secondary | ICD-10-CM | POA: Diagnosis not present

## 2023-10-11 DIAGNOSIS — H401133 Primary open-angle glaucoma, bilateral, severe stage: Secondary | ICD-10-CM | POA: Diagnosis not present

## 2023-10-19 ENCOUNTER — Other Ambulatory Visit: Payer: Self-pay

## 2023-10-19 DIAGNOSIS — K5900 Constipation, unspecified: Secondary | ICD-10-CM

## 2023-11-11 DIAGNOSIS — H16143 Punctate keratitis, bilateral: Secondary | ICD-10-CM | POA: Diagnosis not present

## 2023-11-11 DIAGNOSIS — H35351 Cystoid macular degeneration, right eye: Secondary | ICD-10-CM | POA: Diagnosis not present

## 2023-11-11 DIAGNOSIS — H2512 Age-related nuclear cataract, left eye: Secondary | ICD-10-CM | POA: Diagnosis not present

## 2023-11-11 DIAGNOSIS — H401133 Primary open-angle glaucoma, bilateral, severe stage: Secondary | ICD-10-CM | POA: Diagnosis not present

## 2023-11-16 ENCOUNTER — Other Ambulatory Visit: Payer: Self-pay

## 2023-11-16 DIAGNOSIS — F39 Unspecified mood [affective] disorder: Secondary | ICD-10-CM

## 2023-11-16 DIAGNOSIS — F5101 Primary insomnia: Secondary | ICD-10-CM

## 2023-11-16 NOTE — Telephone Encounter (Signed)
 Copied from CRM #[id number]. Topic: Clinical - Medication Refill >> Nov 16, 2023  8:51 AM Viola F wrote: Medication: ALPRAZolam  (XANAX ) 0.25 MG tablet [[id number]]  Has the patient contacted their pharmacy? Yes (Agent: If no, request that the patient contact the pharmacy for the refill. If patient does not wish to contact the pharmacy document the reason why and proceed with request.) (Agent: If yes, when and what did the pharmacy advise?)  This is the patient's preferred pharmacy:  [facility] - [place], [place] - [address] [place] [address] Phone: [phone] Fax: [phone]  Is this the correct pharmacy for this prescription? Yes If no, delete pharmacy and type the correct one.   Has the prescription been filled recently? Yes  Is the patient out of the medication? No  Has the patient been seen for an appointment in the last year OR does the patient have an upcoming appointment? Yes  Can we respond through MyChart? Yes  Agent: Please be advised that Rx refills may take up to 3 business days. We ask that you follow-up with your pharmacy.

## 2023-11-16 NOTE — Telephone Encounter (Signed)
 1. Mood disorder (Primary) - ALPRAZolam  (XANAX ) 0.25 MG tablet; TAKE 1 TABLET BY MOUTH DAILY AS NEEDED FOR ANXIETY/PANIC  Dispense: 30 tablet; Refill: 0  2. Primary insomnia - ALPRAZolam  (XANAX ) 0.25 MG tablet; TAKE 1 TABLET BY MOUTH DAILY AS NEEDED FOR ANXIETY/PANIC  Dispense: 30 tablet; Refill: 0  PDMP reviewed, last refill for 30 tablets was done on 10/23/23. She is on tapering down dose from Xanax  0.25 mg twice a day to once a day.   Luke Shade, MD

## 2023-11-25 ENCOUNTER — Other Ambulatory Visit: Payer: Self-pay

## 2023-11-25 DIAGNOSIS — K5900 Constipation, unspecified: Secondary | ICD-10-CM

## 2023-12-02 DIAGNOSIS — Z961 Presence of intraocular lens: Secondary | ICD-10-CM | POA: Diagnosis not present

## 2023-12-02 DIAGNOSIS — H401133 Primary open-angle glaucoma, bilateral, severe stage: Secondary | ICD-10-CM | POA: Diagnosis not present

## 2023-12-02 DIAGNOSIS — H2512 Age-related nuclear cataract, left eye: Secondary | ICD-10-CM | POA: Diagnosis not present

## 2023-12-02 DIAGNOSIS — H35351 Cystoid macular degeneration, right eye: Secondary | ICD-10-CM | POA: Diagnosis not present

## 2023-12-20 ENCOUNTER — Telehealth: Payer: Self-pay

## 2023-12-20 DIAGNOSIS — F5101 Primary insomnia: Secondary | ICD-10-CM

## 2023-12-20 DIAGNOSIS — F39 Unspecified mood [affective] disorder: Secondary | ICD-10-CM

## 2023-12-20 NOTE — Telephone Encounter (Unsigned)
 Copied from CRM [id number]. Topic: Clinical - Medication Refill >> Dec 20, 2023  3:02 PM Harlene ORN wrote: Medication: ALPRAZolam  (XANAX ) 0.25 MG tablet Patient will call back to schedule in the beginning of next year. Currently having transportation issues, and has exhausted her funds at this time. Has the patient contacted their pharmacy? Yes (Agent: If no, request that the patient contact the pharmacy for the refill. If patient does not wish to contact the pharmacy document the reason why and proceed with request.) (Agent: If yes, when and what did the pharmacy advise?)  This is the patient's preferred pharmacy:  [facility] - [place], [place] - [address] [place] [address] Phone: [phone] Fax: [phone]  Is this the correct pharmacy for this prescription? Yes If no, delete pharmacy and type the correct one.   Has the prescription been filled recently? No  Is the patient out of the medication? Yes  Has the patient been seen for an appointment in the last year OR does the patient have an upcoming appointment? Yes  Can we respond through MyChart? No  Agent: Please be advised that Rx refills may take up to 3 business days. We ask that you follow-up with your pharmacy.

## 2023-12-21 MED ORDER — ALPRAZOLAM 0.25 MG PO TABS: 0.2500 mg | ORAL_TABLET | Freq: Every evening | ORAL | 0 refills | Status: DC | PRN

## 2023-12-21 NOTE — Telephone Encounter (Signed)
 Please let the patient know I have sent one month refill on Alprazolam . We will need office visit before I am able to continue to further send refill on Alprazolam  as it is a controlled substance.   I understand that she has had difficulty with transportation, I am happy to help her connect with resources, is she interested in talking with a social worker?   1. Mood disorder - ALPRAZolam  (XANAX ) 0.25 MG tablet; Take 1 tablet (0.25 mg total) by mouth at bedtime as needed for anxiety.  Dispense: 30 tablet; Refill: 0  2. Primary insomnia - ALPRAZolam  (XANAX ) 0.25 MG tablet; Take 1 tablet (0.25 mg total) by mouth at bedtime as needed for anxiety.  Dispense: 30 tablet; Refill: 0   Thank you,  Luke Shade, MD

## 2023-12-21 NOTE — Telephone Encounter (Signed)
 Spoke with patient to make her aware of Dr Graylon recommendations. Patient states she did communication with the representative yesterday to let our office know she will be in for a follow up appointment, as she has been having a lot going on with losing her mom recently and having vision issues. Patient states she will be in to follow up with provider after the New Year, as she has to rely on her nephew for transportation. Verbalized understanding.

## 2024-01-27 ENCOUNTER — Ambulatory Visit

## 2024-01-27 VITALS — BP 124/84 | HR 82 | Temp 98.5°F | Ht 67.0 in | Wt 238.4 lb

## 2024-01-27 DIAGNOSIS — F39 Unspecified mood [affective] disorder: Secondary | ICD-10-CM

## 2024-01-27 DIAGNOSIS — Z0189 Encounter for other specified special examinations: Secondary | ICD-10-CM

## 2024-01-27 DIAGNOSIS — E785 Hyperlipidemia, unspecified: Secondary | ICD-10-CM

## 2024-01-27 DIAGNOSIS — E1169 Type 2 diabetes mellitus with other specified complication: Secondary | ICD-10-CM

## 2024-01-27 DIAGNOSIS — K5904 Chronic idiopathic constipation: Secondary | ICD-10-CM

## 2024-01-27 DIAGNOSIS — F5101 Primary insomnia: Secondary | ICD-10-CM | POA: Diagnosis not present

## 2024-01-27 DIAGNOSIS — Z720 Tobacco use: Secondary | ICD-10-CM | POA: Diagnosis not present

## 2024-01-27 DIAGNOSIS — E118 Type 2 diabetes mellitus with unspecified complications: Secondary | ICD-10-CM

## 2024-01-27 DIAGNOSIS — Z1231 Encounter for screening mammogram for malignant neoplasm of breast: Secondary | ICD-10-CM

## 2024-01-27 DIAGNOSIS — Z7985 Long-term (current) use of injectable non-insulin antidiabetic drugs: Secondary | ICD-10-CM

## 2024-01-27 MED ORDER — ALPRAZOLAM 0.25 MG PO TABS: 0.2500 mg | ORAL_TABLET | Freq: Every evening | ORAL | 0 refills | Status: AC | PRN

## 2024-01-27 MED ORDER — LINACLOTIDE 72 MCG PO CAPS: 72.0000 ug | ORAL_CAPSULE | Freq: Every day | ORAL | 1 refills | Status: AC
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
## 2024-01-27 NOTE — Assessment & Plan Note (Addendum)
 Anxiety and depression exacerbated by recent life events (holiday without her mom, relationship with her sister).  Continue escitalopram  20 mg, alprazolam  0.25 mg at bedtime to help with sleep.  No SI, HI. Orders:   ALPRAZolam  (XANAX ) 0.25 MG tablet; Take 1 tablet (0.25 mg total) by mouth at bedtime as needed for anxiety.   ALPRAZolam  (XANAX ) 0.25 MG tablet; Take 1 tablet (0.25 mg total) by mouth at bedtime as needed for anxiety.   ALPRAZolam  (XANAX ) 0.25 MG tablet; Take 1 tablet (0.25 mg total) by mouth at bedtime as needed for anxiety.

## 2024-01-27 NOTE — Assessment & Plan Note (Signed)
 Diabetic foot exam completed.  Diabetic footcare discussed.

## 2024-01-27 NOTE — Assessment & Plan Note (Addendum)
 Recommend taking Linzess  72 mcg capsule once a day.  Symptoms stable on Linzess .  Refill sent. Orders:   linaclotide  (LINZESS ) 72 MCG capsule; Take 1 capsule (72 mcg total) by mouth daily before breakfast.

## 2024-01-27 NOTE — Patient Instructions (Addendum)
 Vitamin D  deficiency: Vitamin D  over the counter 1000 units daily once you are done with prescription strength vitamin D .   For sleep: Try Xanax  0.25 mg at bedtime, one table each night as needed. Stop trazodone .    YOUR MAMMOGRAM IS DUE, PLEASE CALL AND GET THIS SCHEDULED! [facility] - call [phone]   You are due for shingles, covid, tetanus booster vaccine. You can update these at your local primacy.    To help with sleep:  Recommendation Details  Regular bedtime and rise time Having a consistent bedtime and rise time leads to more regular sleep schedules and avoids periods of sleep deprivation or periods of extended wakefulness during the night.  Avoid napping Avoid napping, especially naps lasting longer than 1 hour and naps late in the day.  Limit caffeine Avoid caffeine after lunch. The time between lunch and bedtime represents approximately 2 half-lives for caffeine, and this time window allows for most caffeine to be metabolized before bedtime.  Limit alcohol Recommendations are typically focused on avoiding alcohol near bedtime. Alcohol is initially sedating, but activating as it is metabolized. Alcohol also negatively impacts sleep architecture.  Avoid nicotine Nicotine is a stimulant and should be avoided near bedtime and at night.  Exercise Daytime physical activity is encouraged, in particular, 4 to 6 hours before bedtime, as this may facilitate sleep onset. Rigorous exercise within 2 hours of bedtime is discouraged.  Keep the sleep environment quiet and dark Noise and light exposure during the night can disrupt sleep. White noise or ear plugs are often recommended to reduce disturbing noise. Using blackout shades or an eye mask is commonly recommended to reduce light. This may also include avoiding exposure to television or technology near bedtime, as this can have an impact on circadian rhythms by shifting sleep timing later.  Bedroom clock Avoid checking the time  at night. This includes alarm clocks and other time pieces (eg, watches and smart phones). Checking the time increases cognitive arousal and prolongs wakefulness.  Evening eating Avoid a large meal close to bedtime. Eat a healthy and filling (but not too heavy) meal in the early evening and avoid late-night snacks.

## 2024-01-27 NOTE — Assessment & Plan Note (Addendum)
 Trazodone  did not help with sleep.   Xanax  more effective than trazodone . Discussed long-term Xanax  risks, including dementia. Emphasized sleep hygiene. Discontinued trazodone . Continue Xanax  0.25 mg oral at bedtime as needed, limit to one tablet per day. Provided sleep hygiene instructions. PDMP reviewed. Controlled substance agreement up-to-date. Orders:   ALPRAZolam  (XANAX ) 0.25 MG tablet; Take 1 tablet (0.25 mg total) by mouth at bedtime as needed for anxiety.   ALPRAZolam  (XANAX ) 0.25 MG tablet; Take 1 tablet (0.25 mg total) by mouth at bedtime as needed for anxiety.   ALPRAZolam  (XANAX ) 0.25 MG tablet; Take 1 tablet (0.25 mg total) by mouth at bedtime as needed for anxiety.

## 2024-01-27 NOTE — Assessment & Plan Note (Addendum)
 Complication includes hyperlipidemia  A1c was 6.9% on 04/22/2023.  Due for repeat A1c and urine microalbumin check.  Patient prefers getting lab updated during her next office visit.  Future lab for A1c, urine microalbumin was ordered.  Continue Ozempic  0.5 mg subcutaneous weekly. Continue Metformin  500 mg oral twice daily. Recommend updating shingles, COVID-19, tetanus booster through her local pharmacy. Orders:   Urine Microalbumin w/creat. ratio; Future   HgB A1c; Future

## 2024-01-27 NOTE — Assessment & Plan Note (Addendum)
 Continue rosuvastatin  20 mg at bedtime.  Recommend checking fasting lipid panel and CMP, future lab ordered.  Orders:   Lipid panel; Future   Comp Met (CMET); Future

## 2024-01-27 NOTE — Assessment & Plan Note (Addendum)
 Screening mammogram ordered.  Phone number provided to patient to schedule an appointment. Orders:   MM 3D SCREENING MAMMOGRAM BILATERAL BREAST; Future

## 2024-01-27 NOTE — Assessment & Plan Note (Addendum)
 Continues to smoke. Discussed impact on wound healing and eye health. Consider pharmacological intervention if patient continues to smoke during her next visit in 4 months.

## 2024-03-02 ENCOUNTER — Encounter

## 2024-05-19 ENCOUNTER — Other Ambulatory Visit

## 2024-05-26 ENCOUNTER — Ambulatory Visit
